# Patient Record
Sex: Female | Born: 1935 | Race: White | Hispanic: No | Marital: Married | Smoking: Never smoker
Health system: Southern US, Community
[De-identification: ages and names within clinical notes are randomized; demographics above are authoritative.]

## PROBLEM LIST (undated history)

## (undated) DIAGNOSIS — N281 Cyst of kidney, acquired: Secondary | ICD-10-CM

## (undated) DIAGNOSIS — I4891 Unspecified atrial fibrillation: Secondary | ICD-10-CM

## (undated) DIAGNOSIS — E039 Hypothyroidism, unspecified: Secondary | ICD-10-CM

## (undated) DIAGNOSIS — M1990 Unspecified osteoarthritis, unspecified site: Secondary | ICD-10-CM

## (undated) DIAGNOSIS — I10 Essential (primary) hypertension: Secondary | ICD-10-CM

## (undated) DIAGNOSIS — K589 Irritable bowel syndrome without diarrhea: Secondary | ICD-10-CM

## (undated) DIAGNOSIS — N289 Disorder of kidney and ureter, unspecified: Secondary | ICD-10-CM

## (undated) DIAGNOSIS — F419 Anxiety disorder, unspecified: Secondary | ICD-10-CM

## (undated) DIAGNOSIS — D369 Benign neoplasm, unspecified site: Secondary | ICD-10-CM

## (undated) DIAGNOSIS — M810 Age-related osteoporosis without current pathological fracture: Secondary | ICD-10-CM

## (undated) DIAGNOSIS — I2510 Atherosclerotic heart disease of native coronary artery without angina pectoris: Secondary | ICD-10-CM

## (undated) DIAGNOSIS — N3281 Overactive bladder: Secondary | ICD-10-CM

## (undated) DIAGNOSIS — K5790 Diverticulosis of intestine, part unspecified, without perforation or abscess without bleeding: Secondary | ICD-10-CM

## (undated) HISTORY — PX: KNEE SURGERY: SHX244

## (undated) HISTORY — DX: Irritable bowel syndrome, unspecified: K58.9

## (undated) HISTORY — DX: Overactive bladder: N32.81

## (undated) HISTORY — DX: Disorder of kidney and ureter, unspecified: N28.9

## (undated) HISTORY — DX: Cyst of kidney, acquired: N28.1

## (undated) HISTORY — PX: APPENDECTOMY: SHX54

## (undated) HISTORY — PX: ABDOMINAL HYSTERECTOMY: SHX81

## (undated) HISTORY — PX: ROTATOR CUFF REPAIR: SHX139

## (undated) HISTORY — DX: Age-related osteoporosis without current pathological fracture: M81.0

## (undated) HISTORY — DX: Benign neoplasm, unspecified site: D36.9

## (undated) HISTORY — DX: Unspecified osteoarthritis, unspecified site: M19.90

## (undated) HISTORY — PX: BACK SURGERY: SHX140

## (undated) HISTORY — DX: Diverticulosis of intestine, part unspecified, without perforation or abscess without bleeding: K57.90

## (undated) HISTORY — PX: CATARACT EXTRACTION: SUR2

---

## 2002-06-04 ENCOUNTER — Encounter: Admission: RE | Admit: 2002-06-04 | Discharge: 2002-06-04 | Payer: Self-pay | Admitting: Family Medicine

## 2002-06-04 ENCOUNTER — Encounter: Payer: Self-pay | Admitting: Family Medicine

## 2002-09-08 ENCOUNTER — Encounter: Payer: Self-pay | Admitting: Family Medicine

## 2002-09-08 ENCOUNTER — Encounter: Admission: RE | Admit: 2002-09-08 | Discharge: 2002-09-08 | Payer: Self-pay | Admitting: Family Medicine

## 2002-09-09 ENCOUNTER — Encounter: Payer: Self-pay | Admitting: Family Medicine

## 2002-09-09 ENCOUNTER — Encounter: Admission: RE | Admit: 2002-09-09 | Discharge: 2002-09-09 | Payer: Self-pay | Admitting: Family Medicine

## 2002-09-20 ENCOUNTER — Encounter: Payer: Self-pay | Admitting: Family Medicine

## 2002-09-20 ENCOUNTER — Encounter: Admission: RE | Admit: 2002-09-20 | Discharge: 2002-09-20 | Payer: Self-pay | Admitting: Family Medicine

## 2003-09-02 ENCOUNTER — Encounter: Admission: RE | Admit: 2003-09-02 | Discharge: 2003-09-02 | Payer: Self-pay | Admitting: Family Medicine

## 2003-09-02 ENCOUNTER — Encounter: Payer: Self-pay | Admitting: Family Medicine

## 2003-11-09 ENCOUNTER — Inpatient Hospital Stay (HOSPITAL_COMMUNITY): Admission: RE | Admit: 2003-11-09 | Discharge: 2003-11-10 | Payer: Self-pay | Admitting: Orthopaedic Surgery

## 2004-10-22 ENCOUNTER — Encounter: Admission: RE | Admit: 2004-10-22 | Discharge: 2004-10-22 | Payer: Self-pay | Admitting: Family Medicine

## 2004-12-04 ENCOUNTER — Encounter: Admission: RE | Admit: 2004-12-04 | Discharge: 2004-12-04 | Payer: Self-pay | Admitting: Family Medicine

## 2004-12-19 ENCOUNTER — Encounter: Admission: RE | Admit: 2004-12-19 | Discharge: 2004-12-19 | Payer: Self-pay | Admitting: Orthopaedic Surgery

## 2005-12-03 ENCOUNTER — Ambulatory Visit (HOSPITAL_COMMUNITY): Admission: RE | Admit: 2005-12-03 | Discharge: 2005-12-03 | Payer: Self-pay | Admitting: Gastroenterology

## 2006-02-07 ENCOUNTER — Encounter: Admission: RE | Admit: 2006-02-07 | Discharge: 2006-02-07 | Payer: Self-pay | Admitting: Family Medicine

## 2006-06-11 ENCOUNTER — Other Ambulatory Visit: Admission: RE | Admit: 2006-06-11 | Discharge: 2006-06-11 | Payer: Self-pay | Admitting: Family Medicine

## 2006-09-23 ENCOUNTER — Ambulatory Visit (HOSPITAL_COMMUNITY): Admission: RE | Admit: 2006-09-23 | Discharge: 2006-09-23 | Payer: Self-pay | Admitting: Urology

## 2007-03-18 ENCOUNTER — Encounter: Admission: RE | Admit: 2007-03-18 | Discharge: 2007-03-18 | Payer: Self-pay | Admitting: Family Medicine

## 2008-02-18 ENCOUNTER — Encounter: Admission: RE | Admit: 2008-02-18 | Discharge: 2008-02-18 | Payer: Self-pay | Admitting: Family Medicine

## 2008-02-26 ENCOUNTER — Encounter: Admission: RE | Admit: 2008-02-26 | Discharge: 2008-02-26 | Payer: Self-pay | Admitting: Family Medicine

## 2008-04-06 ENCOUNTER — Encounter: Admission: RE | Admit: 2008-04-06 | Discharge: 2008-04-06 | Payer: Self-pay | Admitting: Family Medicine

## 2008-05-29 ENCOUNTER — Encounter: Admission: RE | Admit: 2008-05-29 | Discharge: 2008-05-29 | Payer: Self-pay | Admitting: Urology

## 2008-06-05 ENCOUNTER — Emergency Department (HOSPITAL_COMMUNITY): Admission: EM | Admit: 2008-06-05 | Discharge: 2008-06-06 | Payer: Self-pay | Admitting: Emergency Medicine

## 2008-12-28 ENCOUNTER — Encounter: Admission: RE | Admit: 2008-12-28 | Discharge: 2008-12-28 | Payer: Self-pay | Admitting: Family Medicine

## 2009-01-03 ENCOUNTER — Encounter: Admission: RE | Admit: 2009-01-03 | Discharge: 2009-01-03 | Payer: Self-pay | Admitting: Family Medicine

## 2009-06-10 ENCOUNTER — Encounter
Admission: RE | Admit: 2009-06-10 | Discharge: 2009-06-10 | Payer: Self-pay | Admitting: Physical Medicine and Rehabilitation

## 2010-05-21 ENCOUNTER — Encounter: Admission: RE | Admit: 2010-05-21 | Discharge: 2010-05-21 | Payer: Self-pay | Admitting: Gastroenterology

## 2010-08-31 ENCOUNTER — Encounter: Admission: RE | Admit: 2010-08-31 | Discharge: 2010-08-31 | Payer: Self-pay | Admitting: Family Medicine

## 2010-10-19 ENCOUNTER — Encounter: Admission: RE | Admit: 2010-10-19 | Discharge: 2010-10-19 | Payer: Self-pay | Admitting: Geriatric Medicine

## 2011-05-17 NOTE — Op Note (Signed)
NAME:  Linda Avila, GIEL                             ACCOUNT NO.:  [id number]   MEDICAL RECORD NO.:  [id number]                   PATIENT TYPE:  OIB   LOCATION:  2550                                 FACILITY:  [facility]   PHYSICIAN:  Sharolyn Douglas, M.D.                     DATE OF BIRTH:  08/02/1928   DATE OF PROCEDURE:  11/09/2003  DATE OF DISCHARGE:                                 OPERATIVE REPORT   PREOPERATIVE DIAGNOSIS:  C5-6 herniated nucleus pulposus with right upper  extremity radiculopathy.   PROCEDURE:  1. Anterior cervical diskectomy C5-6.  2. Anterior cervical arthrodesis C5-6 with placement of an 8 mm Nuvasive     allograft prosthesis spacer packed with local autogenous bone graft.  3. Anterior cervical plating C5-6 utilizing the Trinica system.   SURGEON:  Sharolyn Douglas, M.D.   ASSISTANT:  Verlin Fester, P.A.   ANESTHESIA:  General endotracheal.   COMPLICATIONS:  None.   INDICATIONS FOR PROCEDURE:  The patient is a 77 year old female with severe  intractable right upper extremity pain and weakness.  Her MRI scan shows a  large disk herniation at C5-6 which compresses the thecal sac and encroaches  on the right foramen.  She has been unresponsive to all conservative care.  She is taking escalating doses of narcotics.  I extensively reviewed the  risks and benefits of ACDF at C5-6 with her and she elected to proceed.  She  understands the risks of adjacent same, degeneration, hardware failure and  pseudoarthrosis.   DESCRIPTION OF PROCEDURE:  The patient was properly identified in the  holding area and taken to the operating room. She underwent general  endotracheal anesthesia without difficulty.  She was given prophylactic IV  antibiotics.  She was carefully positioned on the operating table with her  neck in slight extension.  5 pounds of Halter traction was applied.  The  neck was prepped and draped in the usual sterile fashion.  A 4 cm incision  was made on the left side of  her neck in a transverse fashion, just proximal  to the cricoid cartilage.  Dissection was carried sharply through the  platysma.  The interval between the SCM and strap muscles were developed  down to the prevertebral space.  The first disk space identified was C5-6.  Spinal needle was placed.  X-ray taken to confirm location.  The esophagus,  trachea, carotid sheath were identified and protected at all times.  We then  elevated the longus coli muscle out of the C5-6 disk space bilaterally.  Deep shadowline retractor placed.  We then performed a subtotal diskectomy  back to the posterior longitudinal ligament.  The posterior vertebral  margins were undercut using a Kerrison punch.  We immediately removed  several moderately large pieces of disk material in the left posterolateral  portion of the disk space.  We then  took down the posterior longitudinal  ligament and several additional pieces of disk material were removed in a  subligamentous position.  The foramen was explored with a nerve hook and  again we were able to fish out several pieces of disk material.  The wound  was copiously irrigated.  Bleeding was controlled with bipolar  electrocautery and Gelfoam.  We then placed an 8 mm Nuvasive allograft  prosthesis spacer that had been packed with local autogenous bone collected  from the bur shavings.  We placed a Trinica plate with four 12 mm screws.  We engaged the locking mechanism.  The wound was irrigated.  We examined the  esophagus, trachea, and carotid sheath and there were no apparent injuries.  Penrose drain placed.  Platysma closed with an interrupted 2-0 Vicryl,  subcutaneous layer closed with a 3-0 Vicryl followed by a running 4-0  subcuticular suture on the skin.  Benzoin and Steri-Strips were placed.  A  sterile dressing applied.  The patient was placed into a soft cervical  collar, extubated without difficulty, and transferred to the recovery room  in stable condition,  able to move her upper and lower extremities.                                               Sharolyn Douglas, M.D.    MC/MEDQ  D:  11/09/2003  T:  11/10/2003  Job:  [id number]

## 2011-05-17 NOTE — H&P (Signed)
NAME:  Linda Avila, Linda Avila                             ACCOUNT NO.:  [id number]   MEDICAL RECORD NO.:  [id number]                   PATIENT TYPE:  OIB   LOCATION:  NA                                   FACILITY:  [facility]   PHYSICIAN:  Sharolyn Douglas, M.D.                     DATE OF BIRTH:  1928/05/31   DATE OF ADMISSION:  11/09/2003  DATE OF DISCHARGE:                                HISTORY & PHYSICAL   CHIEF COMPLAINT:  Neck and right upper extremity pain as well as right upper  extremity paresthesias and numbness.   HISTORY OF PRESENT ILLNESS:  The patient is a 77 year old female with neck  and right upper extremity pain that has been severe for the last  approximately 20 days.  She reports prior to 20 days ago, she was not having  any sharp pain in her neck, just occasional aches and pains.  This pain is  severe in nature.  She is having to take very strong narcotic pain  medication to control it, escalating dose and she is absolutely miserable.  She said she cannot live like this any longer.  The pain is so severe.  Because of her severe findings as well as her MRI findings of an HNP at C5-6  with pressure on the C6 nerve root, it was thought that her best course of  management would be ACDF procedure.  Risks and benefits of the procedure  were discussed with the patient as well as her other options.  She indicated  understanding and opted to proceed with surgery.   ALLERGIES:  No known drug allergies.   MEDICATIONS:  1. Levoxyl 100 mcg daily.  2. Actonel q. week.  3. Celebrex daily.  4. Detrol LA 4 mg daily.  5. Norvasc 5 mg daily.  6. Methylprednisolone Dosepak 12 days.  7. Percocet 7.5 p.o. p.r.n.  8. Flexeril p.r.n.   PAST MEDICAL HISTORY:  1. Hypothyroidism.  2. Hypertension.  3. Urinary incontinence.  4. Osteoporosis.   PAST SURGICAL HISTORY:  1. Lumbar surgery in 1997 by Dr. Otelia Sergeant.  2. Right knee scope by Dr. Thomasena Edis in 1998.   SOCIAL HISTORY:  The patient denies  tobacco use and denies alcohol use.  She  is married, retired.  Her husband will be available to help her  postoperatively.  She has six grown children.   FAMILY HISTORY:  This is significant for coronary artery disease and stomach  cancer.   REVIEW OF SYMPTOMS:  She denies fevers, chills, sweats, or bleeding.  __________ blurred vision, double vision, seizures, headache, paralysis.  CARDIOVASCULAR:  Denies chest pain, __________, orthopnea, claudication or  palpitations.  PULMONARY:  Denies shortness of breath, productive cough or  hemoptysis.  GI:  Denies nausea or vomiting.  Positive constipation  secondary to pain medications.  Denies melena, bloody stool or diarrhea.  GU:  Denies dysuria, hematuria or discharge.  MUSCULOSKELETAL:  As per HPI.   PHYSICAL EXAMINATION:  GENERAL APPEARANCE:  The patient is a 77 year old  white female who is alert and oriented in no acute distress.  She is well-  nourished and well-groomed.  The patient today is pleasant and cooperative  to examination.  VITAL SIGNS:  Blood pressure 144/80, respiratory rate 16 and unlabored.  Pulse 80 and regular.  HEENT:  Head is normocephalic and atraumatic.  Pupils are equal, round and  reactive to light.  Extraocular movements intact.  Nose patent.  Pharynx  clear.  NECK:  Soft to palpation with no lymphadenopathy, thyromegaly or bruits  appreciated.  She is wearing a cervical collar today.  CHEST:  Clear to auscultation bilaterally.  No rales, rhonchi, stridor,  wheezes or friction rubs.  BREASTS:  Not pertinent and not performed.  CARDIOVASCULAR:  Regular rate and rhythm with no murmurs, rubs, or gallops  noted.  ABDOMEN:  Soft to palpation with positive bowel sounds throughout, nontender  and nondistended with no organomegaly noted.  GU:  Not pertinent and not performed.  EXTREMITIES:  The patient has right upper extremity pain.  Motor function  and sensation grossly intact.  Pulses intact and symmetric.   Skin intact  without any lesions or rashes.   MRI shows HNP at C5-6 and degenerative disc disease at C5-6.   IMPRESSION:  1. Degenerative disc disease and herniated nucleus pulposus at C5-6 with     right C6 radiculopathy and severe pain.  2. Hypertension.  3. Hypothyroidism.  4. Urinary incontinence.   PLAN:  Admit to [facility] on November 09, 2003, for an  ACDF of C5-6.  This will be done by Dr. Sharolyn Douglas.  The patient has a soft  cervical collar to use both preoperatively and postoperatively.      Verlin Fester, P.A.                       Sharolyn Douglas, M.D.    CM/MEDQ  D:  11/09/2003  T:  11/09/2003  Job:  [id number]

## 2011-09-05 ENCOUNTER — Other Ambulatory Visit: Payer: Self-pay | Admitting: Geriatric Medicine

## 2011-09-05 DIAGNOSIS — Z1231 Encounter for screening mammogram for malignant neoplasm of breast: Secondary | ICD-10-CM

## 2011-10-03 ENCOUNTER — Ambulatory Visit
Admission: RE | Admit: 2011-10-03 | Discharge: 2011-10-03 | Disposition: A | Discharge status: Home / Self Care | Payer: Medicare Other | Source: Ambulatory Visit | Attending: Geriatric Medicine | Admitting: Geriatric Medicine

## 2011-10-03 DIAGNOSIS — Z1231 Encounter for screening mammogram for malignant neoplasm of breast: Secondary | ICD-10-CM

## 2012-02-10 ENCOUNTER — Other Ambulatory Visit: Payer: Self-pay | Admitting: Geriatric Medicine

## 2012-02-10 DIAGNOSIS — R1013 Epigastric pain: Secondary | ICD-10-CM

## 2012-02-13 ENCOUNTER — Ambulatory Visit
Admission: RE | Admit: 2012-02-13 | Discharge: 2012-02-13 | Disposition: A | Discharge status: Home / Self Care | Payer: Medicare Other | Source: Ambulatory Visit | Attending: Geriatric Medicine | Admitting: Geriatric Medicine

## 2012-02-13 DIAGNOSIS — R1013 Epigastric pain: Secondary | ICD-10-CM

## 2012-02-13 MED ORDER — IOHEXOL 300 MG/ML  SOLN
100.0000 mL | Freq: Once | INTRAMUSCULAR | Status: AC | PRN
  Administered 2012-02-13: 75 mL via INTRAVENOUS

## 2012-07-06 DIAGNOSIS — I2510 Atherosclerotic heart disease of native coronary artery without angina pectoris: Secondary | ICD-10-CM | POA: Diagnosis present

## 2012-07-06 DIAGNOSIS — F411 Generalized anxiety disorder: Secondary | ICD-10-CM | POA: Diagnosis present

## 2012-07-06 DIAGNOSIS — I10 Essential (primary) hypertension: Secondary | ICD-10-CM | POA: Diagnosis present

## 2012-07-06 DIAGNOSIS — T45511A Poisoning by anticoagulants, accidental (unintentional), initial encounter: Principal | ICD-10-CM | POA: Diagnosis present

## 2012-07-06 DIAGNOSIS — I4891 Unspecified atrial fibrillation: Secondary | ICD-10-CM | POA: Diagnosis present

## 2012-07-06 DIAGNOSIS — T458X1A Poisoning by other primarily systemic and hematological agents, accidental (unintentional), initial encounter: Secondary | ICD-10-CM | POA: Diagnosis present

## 2012-07-06 DIAGNOSIS — D689 Coagulation defect, unspecified: Secondary | ICD-10-CM | POA: Diagnosis present

## 2012-07-06 DIAGNOSIS — E039 Hypothyroidism, unspecified: Secondary | ICD-10-CM | POA: Diagnosis present

## 2012-07-06 DIAGNOSIS — F1910 Other psychoactive substance abuse, uncomplicated: Secondary | ICD-10-CM | POA: Diagnosis present

## 2012-07-07 ENCOUNTER — Encounter (HOSPITAL_COMMUNITY): Payer: Self-pay | Admitting: *Deleted

## 2012-07-07 ENCOUNTER — Inpatient Hospital Stay (HOSPITAL_COMMUNITY)
Admission: EM | Admit: 2012-07-07 | Discharge: 2012-07-07 | DRG: 918 | Disposition: A | Discharge status: Home / Self Care | Payer: Medicare Other | Attending: Internal Medicine | Admitting: Internal Medicine

## 2012-07-07 ENCOUNTER — Inpatient Hospital Stay (HOSPITAL_COMMUNITY): Payer: Medicare Other

## 2012-07-07 DIAGNOSIS — I10 Essential (primary) hypertension: Secondary | ICD-10-CM

## 2012-07-07 DIAGNOSIS — D689 Coagulation defect, unspecified: Secondary | ICD-10-CM

## 2012-07-07 DIAGNOSIS — I480 Paroxysmal atrial fibrillation: Secondary | ICD-10-CM

## 2012-07-07 DIAGNOSIS — T50904A Poisoning by unspecified drugs, medicaments and biological substances, undetermined, initial encounter: Secondary | ICD-10-CM

## 2012-07-07 DIAGNOSIS — T45515A Adverse effect of anticoagulants, initial encounter: Secondary | ICD-10-CM

## 2012-07-07 DIAGNOSIS — T45511A Poisoning by anticoagulants, accidental (unintentional), initial encounter: Secondary | ICD-10-CM

## 2012-07-07 DIAGNOSIS — T50901A Poisoning by unspecified drugs, medicaments and biological substances, accidental (unintentional), initial encounter: Secondary | ICD-10-CM

## 2012-07-07 DIAGNOSIS — R519 Headache, unspecified: Secondary | ICD-10-CM | POA: Diagnosis present

## 2012-07-07 DIAGNOSIS — I4891 Unspecified atrial fibrillation: Secondary | ICD-10-CM

## 2012-07-07 DIAGNOSIS — D6832 Hemorrhagic disorder due to extrinsic circulating anticoagulants: Secondary | ICD-10-CM

## 2012-07-07 DIAGNOSIS — R51 Headache: Secondary | ICD-10-CM

## 2012-07-07 DIAGNOSIS — I2510 Atherosclerotic heart disease of native coronary artery without angina pectoris: Secondary | ICD-10-CM

## 2012-07-07 DIAGNOSIS — E039 Hypothyroidism, unspecified: Secondary | ICD-10-CM

## 2012-07-07 HISTORY — DX: Unspecified atrial fibrillation: I48.91

## 2012-07-07 HISTORY — DX: Essential (primary) hypertension: I10

## 2012-07-07 HISTORY — DX: Atherosclerotic heart disease of native coronary artery without angina pectoris: I25.10

## 2012-07-07 HISTORY — DX: Anxiety disorder, unspecified: F41.9

## 2012-07-07 HISTORY — DX: Hypothyroidism, unspecified: E03.9

## 2012-07-07 LAB — COMPREHENSIVE METABOLIC PANEL
ALT: 20 U/L (ref 0–35)
AST: 25 U/L (ref 0–37)
AST: 22 U/L (ref 0–37)
Albumin: 3.1 g/dL — ABNORMAL LOW (ref 3.5–5.2)
BUN: 29 mg/dL — ABNORMAL HIGH (ref 6–23)
CO2: 27 mEq/L (ref 19–32)
Calcium: 8.8 mg/dL (ref 8.4–10.5)
Calcium: 8.8 mg/dL (ref 8.4–10.5)
Chloride: 104 mEq/L (ref 96–112)
Creatinine, Ser: 1.36 mg/dL — ABNORMAL HIGH (ref 0.50–1.10)
Creatinine, Ser: 1.06 mg/dL (ref 0.50–1.10)
GFR calc Af Amer: 41 mL/min — ABNORMAL LOW (ref >90)
GFR calc non Af Amer: 35 mL/min — ABNORMAL LOW (ref >90)
Sodium: 137 mEq/L (ref 135–145)
Total Bilirubin: 0.5 mg/dL (ref 0.3–1.2)
Total Protein: 6.6 g/dL (ref 6.0–8.3)
Total Protein: 5.8 g/dL — ABNORMAL LOW (ref 6.0–8.3)

## 2012-07-07 LAB — CBC WITH DIFFERENTIAL/PLATELET
Basophils Absolute: 0 10*3/uL (ref 0.0–0.1)
Eosinophils Absolute: 0 10*3/uL (ref 0.0–0.7)
Eosinophils Relative: 0 % (ref 0–5)
HCT: 36.2 % (ref 36.0–46.0)
MCH: 31.8 pg (ref 26.0–34.0)
MCHC: 33.4 g/dL (ref 30.0–36.0)
MCV: 95 fL (ref 78.0–100.0)
Monocytes Absolute: 1.2 10*3/uL — ABNORMAL HIGH (ref 0.1–1.0)
Platelets: 314 10*3/uL (ref 150–400)
RDW: 15.8 % — ABNORMAL HIGH (ref 11.5–15.5)

## 2012-07-07 LAB — CBC
Hemoglobin: 10.6 g/dL — ABNORMAL LOW (ref 12.0–15.0)
MCH: 30.4 pg (ref 26.0–34.0)
MCV: 92.6 fL (ref 78.0–100.0)
Platelets: 270 10*3/uL (ref 150–400)
RBC: 3.49 MIL/uL — ABNORMAL LOW (ref 3.87–5.11)
WBC: 16.5 10*3/uL — ABNORMAL HIGH (ref 4.0–10.5)

## 2012-07-07 LAB — TYPE AND SCREEN: ABO/RH(D): A POS

## 2012-07-07 LAB — ABO/RH: ABO/RH(D): A POS

## 2012-07-07 LAB — PROTIME-INR
INR: >10 (ref 0.00–1.49)
Prothrombin Time: 24.1 seconds — ABNORMAL HIGH (ref 11.6–15.2)

## 2012-07-07 LAB — APTT: aPTT: 34 seconds (ref 24–37)

## 2012-07-07 MED ORDER — DULOXETINE HCL 30 MG PO CPEP
30.0000 mg | ORAL_CAPSULE | Freq: Two times a day (BID) | ORAL | Status: DC
  Administered 2012-07-07: 30 mg via ORAL
  Filled 2012-07-07 (×2): qty 1

## 2012-07-07 MED ORDER — DILTIAZEM HCL ER 120 MG PO CP24
120.0000 mg | ORAL_CAPSULE | Freq: Every day | ORAL | Status: DC
  Administered 2012-07-07: 120 mg via ORAL
  Filled 2012-07-07: qty 1

## 2012-07-07 MED ORDER — LOSARTAN POTASSIUM 50 MG PO TABS
50.0000 mg | ORAL_TABLET | Freq: Every day | ORAL | Status: DC
  Administered 2012-07-07: 50 mg via ORAL
  Filled 2012-07-07: qty 1

## 2012-07-07 MED ORDER — WARFARIN SODIUM 1 MG PO TABS: ORAL_TABLET | ORAL | Status: DC

## 2012-07-07 MED ORDER — ALPRAZOLAM 0.25 MG PO TABS: 0.2500 mg | ORAL_TABLET | Freq: Every evening | ORAL | Status: DC | PRN

## 2012-07-07 MED ORDER — ACETAMINOPHEN 325 MG PO TABS: 650.0000 mg | ORAL_TABLET | Freq: Four times a day (QID) | ORAL | Status: DC | PRN

## 2012-07-07 MED ORDER — PNEUMOCOCCAL VAC POLYVALENT 25 MCG/0.5ML IJ INJ: 0.5000 mL | INJECTION | INTRAMUSCULAR | Status: DC

## 2012-07-07 MED ORDER — SODIUM CHLORIDE 0.9 % IV SOLN: 250.0000 mL | INTRAVENOUS | Status: DC | PRN

## 2012-07-07 MED ORDER — ACETAMINOPHEN 650 MG RE SUPP: 650.0000 mg | Freq: Four times a day (QID) | RECTAL | Status: DC | PRN

## 2012-07-07 MED ORDER — LEVOTHYROXINE SODIUM 88 MCG PO TABS
88.0000 ug | ORAL_TABLET | Freq: Every day | ORAL | Status: DC
  Administered 2012-07-07: 88 ug via ORAL
  Filled 2012-07-07 (×2): qty 1

## 2012-07-07 MED ORDER — OXYCODONE HCL 5 MG PO TABS: 5.0000 mg | ORAL_TABLET | Freq: Three times a day (TID) | ORAL | Status: DC | PRN

## 2012-07-07 MED ORDER — VITAMIN K1 10 MG/ML IJ SOLN
10.0000 mg | INTRAMUSCULAR | Status: AC
  Administered 2012-07-07: 10 mg via INTRAVENOUS
  Filled 2012-07-07: qty 1

## 2012-07-07 MED ORDER — SODIUM CHLORIDE 0.9 % IJ SOLN
3.0000 mL | Freq: Two times a day (BID) | INTRAMUSCULAR | Status: DC
  Administered 2012-07-07: 3 mL via INTRAVENOUS

## 2012-07-07 MED ORDER — WARFARIN SODIUM 1 MG PO TABS: 1.0000 mg | ORAL_TABLET | ORAL | Status: DC

## 2012-07-07 MED ORDER — PREDNISONE 5 MG PO TABS
5.0000 mg | ORAL_TABLET | Freq: Two times a day (BID) | ORAL | Status: DC
  Administered 2012-07-07: 5 mg via ORAL
  Filled 2012-07-07 (×3): qty 1

## 2012-07-07 MED ORDER — LEVOTHYROXINE SODIUM 88 MCG PO TABS
88.0000 ug | ORAL_TABLET | Freq: Every day | ORAL | Status: DC
  Filled 2012-07-07 (×2): qty 1

## 2012-07-07 MED ORDER — SODIUM CHLORIDE 0.9 % IJ SOLN: 3.0000 mL | INTRAMUSCULAR | Status: DC | PRN

## 2012-07-07 NOTE — ED Notes (Addendum)
Pt was seen by her physician yesterday to check her coumadin levels.  Pt was called yesterday evening after the appointment  at 2300 and was told to come in to the Emergency Department for further evaluation.    Pt is complaining of left hip and lower back pain - which she denies any injury.  Pt also complains of pain on left ankle pain.  There is significant swelling and bruising on the area.  Pt reports "being unsteady" on her feet when she walks.  She reports new bruising on lower lip.

## 2012-07-07 NOTE — ED Notes (Signed)
Called to check on status of PT-INR - lab stated it needed to be repeated.  Will continue to check back on status.

## 2012-07-07 NOTE — ED Notes (Signed)
Dr. Norlene Campbell notified of critical PT-INR

## 2012-07-07 NOTE — ED Notes (Signed)
Patient transported to CT 

## 2012-07-07 NOTE — Progress Notes (Addendum)
Subjective:    She was seen yesterday by Alfonse Ras, Pharm.D. At the [facility] cardiology clinic where her INR point-of-care registered air or message. She was sent to the lab for further evaluation where that measurement was also measuring error. He had given her vitamin K 2.5 mg by mouth in clinic and then called her that evening at my instruction and gave her another 2.5 mg of vitamin K and at that time she stated that she was having more bruising on her ankle/thigh/oozing from her venipuncture site. He instructed her to urgently go to the emergency room which she did.  In the emergency room she received 10 mg of IV vitamin K. Her INR preceding that was greater than 10. Head CT was unremarkable.  Currently she is feeling well, no bruising, no bleeding, no shortness of breath, no chest pain.  We reviewed her medication dosing of Coumadin. She was originally started on 5 mg once a day but only for a few days and she understands this. She had been on 1 mg tablets with occasional 2 mg dosing. She does not admit to taking any medication error. No antibiotics.   Objective:  Vital Signs in the last 24 hours: Temp:  [98.1 F (36.7 C)-98.6 F (37 C)] 98.2 F (36.8 C) (07/09 0800) Pulse Rate:  [69-93] 72  (07/09 0800) Resp:  [12-19] 19  (07/09 0800) BP: (130-159)/(47-81) 154/73 mmHg (07/09 0800) SpO2:  [97 %-100 %] 99 % (07/09 0800) Weight:  [66.3 kg (146 lb 2.6 oz)] 66.3 kg (146 lb 2.6 oz) (07/09 0545)  Intake/Output from previous day:     Physical Exam: General: Well developed, well nourished, in no acute distress. Head:  Normocephalic and atraumatic. Lungs: Clear to auscultation and percussion. Heart: Irreg, No murmur, rubs or gallops.  Abdomen: soft, non-tender, positive bowel sounds. Extremities: Bruising right ankle, right thigh, right hand coagulation and webbing of digits, silver nitrate use yesterday. Neurologic: Alert and oriented x 3.    Lab Results:  [facility] 07/07/12 0034    WBC 15.4*  HGB 12.1  PLT 314    Basename 07/07/12 0034  NA 137  K 4.3  CL 101  CO2 27  GLUCOSE 135*  BUN 35*  CREATININE 1.36*   No results found for this basename: TROPONINI:2,CK,MB:2 in the last 72 hours Hepatic Function Panel  Basename 07/07/12 0034  PROT 6.6  ALBUMIN 3.5  AST 25  ALT 20  ALKPHOS 62  BILITOT 0.3  BILIDIR --  IBILI --   No results found for this basename: CHOL in the last 72 hours No results found for this basename: PROTIME in the last 72 hours  Imaging: Ct Head Wo Contrast  07/07/2012  *RADIOLOGY REPORT*  Clinical Data: Unsteady on feet when ambulating.  CT HEAD WITHOUT CONTRAST  Technique:  Contiguous axial images were obtained from the base of the skull through the vertex without contrast.  Comparison: CT of the head performed 12/28/2008  Findings: There is no evidence of acute infarction, mass lesion, or intra- or extra-axial hemorrhage on CT.  Diffuse periventricular and subcortical white matter change likely reflects small vessel ischemic microangiopathy.  Chronic ischemic change is noted within the basal ganglia bilaterally.  Mild cerebellar atrophy is noted.  Prominence of the sulci suggests mild cortical volume loss.  The brainstem and fourth ventricle are within normal limits.  The basal ganglia are unremarkable in appearance.  The cerebral hemispheres demonstrate grossly normal gray-white differentiation. No mass effect or midline shift is seen.  There is no evidence of fracture; visualized osseous structures are unremarkable in appearance.  The visualized portions of the orbits are within normal limits.  The paranasal sinuses and mastoid air cells are well-aerated.  No significant soft tissue abnormalities are seen.  IMPRESSION:  1.  No acute intracranial pathology seen on CT. 2.  Diffuse small vessel ischemic microangiopathy and mild cortical volume loss; chronic ischemic change within the basal ganglia.  Original Report Authenticated By: Tonia Ghent, M.D.   Personally viewed.    Assessment/Plan:  Principal Problem:  *Coagulopathy Active Problems:  Atrial fibrillation  Headache   - I appreciate the excellent care given by hospitalist team. She has received a total of 5 mg of by mouth vitamin K yesterday as well as 10 mg IV of vitamin K in the emergency room. Hopefully her coagulopathy/elevated INR has been reversed. Her labs have been drawn to repeat her PT/INR. She did not receive FFP.  - For now, of course, we will hold Coumadin for her atrial fibrillation. - [employer], [address] our pharmacist at cardiology clinic, will be seeing her in followup with me as well once coagulopathy is corrected and she safe for discharge.  - She understands to contact us immediately if any further bleeding occurs.  -  I'm unsure of why the coagulopathy occurred. When hypophysis could certainly be that she was mistakingly taking 5 mg tablets instead of 1 mg tablets but she does not think that this is the case. Her daughter will be looking through her medications. Regardless, we will have to be very careful if Coumadin reinitiation takes place. We will decide this on an outpatient basis.  Atrial fibrillation-stable.Rate control.  Appears sinus with PAC's currently. Continue home medications.  If further assistance is needed these do not hesitate to call.  Donato Schultz 07/07/2012, 8:37 AM  Per daughter reviewed her medicines and she was taking 5 mg tablets instead of 1 mg tablets. I have notified [employer], [address]D.who will investigate the situation. Mrs. Eickhoff clearly states that the original prescription for 5 mg did not have any further pills in that bottle. We will closely monitor. I spoke personally with Misty Stanley her daughter on the phone about this. I thanked her for reviewing her prescriptions.

## 2012-07-07 NOTE — Care Management Note (Signed)
    Page 1 of 1   07/07/2012     8:44:50 AM   CARE MANAGEMENT NOTE 07/07/2012  Patient:  Linda Avila, Linda Avila   Account Number:  [id number]  Date Initiated:  07/07/2012  Documentation initiated by:  Junius Creamer  Subjective/Objective Assessment:   adm w suprtherapeutic inr     Action/Plan:   lives w husband, pcp dr Ann Maki [place]   Anticipated DC Date:     Anticipated DC Plan:        DC Planning Services  CM consult      Choice offered to / List presented to:             Status of service:   Medicare Important Message given?   (If response is "NO", the following Medicare IM given date fields will be blank) Date Medicare IM given:   Date Additional Medicare IM given:    Discharge Disposition:    Per UR Regulation:  Reviewed for med. necessity/level of care/duration of stay  If discussed at Long Length of Stay Meetings, dates discussed:    Comments:  7/9 8:44a debbie Briellah Baik rn,bsn [phone]

## 2012-07-07 NOTE — Discharge Summary (Signed)
DISCHARGE SUMMARY  Linda Avila  MR#: [id number]  DOB:July 04, 1928  Date of Admission: 07/07/2012 Date of Discharge: 07/07/2012  Attending Physician:James Danise Mina, MD  Patient's OZD:[id number],QVZ Maisie Fus, MD  Consults: Loraine Leriche Skains,MD/Cardiology  Pertinent discharge information: Patient presented to the hospital with an iatrogenic coagulopathy due to prescription error. The patient's prescription was filled as a 5 mg dosage when in actuality a 1 mg prescription had been ordered. The patient subsequently presented with an INR greater than 10.  Per orders of her cardiologist she is to hold her Coumadin until she is reevaluated at the Coumadin clinic on 07/08/2012.  Discharge Diagnoses: Principal Problem:  *Warfarin-induced coagulopathy Active Problems:  PAF (paroxysmal atrial fibrillation)  Headache  HTN (hypertension)  Hypothyroidism  CAD (coronary artery disease)   Radiology: Ct Head Wo Contrast  07/07/2012  *RADIOLOGY REPORT*  Clinical Data: Unsteady on feet when ambulating.  CT HEAD WITHOUT CONTRAST  Technique:  Contiguous axial images were obtained from the base of the skull through the vertex without contrast.  Comparison: CT of the head performed 12/28/2008  Findings: There is no evidence of acute infarction, mass lesion, or intra- or extra-axial hemorrhage on CT.  Diffuse periventricular and subcortical white matter change likely reflects small vessel ischemic microangiopathy.  Chronic ischemic change is noted within the basal ganglia bilaterally.  Mild cerebellar atrophy is noted.  Prominence of the sulci suggests mild cortical volume loss.  The brainstem and fourth ventricle are within normal limits.  The basal ganglia are unremarkable in appearance.  The cerebral hemispheres demonstrate grossly normal gray-white differentiation. No mass effect or midline shift is seen.  There is no evidence of fracture; visualized osseous structures are unremarkable in appearance.  The visualized  portions of the orbits are within normal limits.  The paranasal sinuses and mastoid air cells are well-aerated.  No significant soft tissue abnormalities are seen.  IMPRESSION:  1.  No acute intracranial pathology seen on CT. 2.  Diffuse small vessel ischemic microangiopathy and mild cortical volume loss; chronic ischemic change within the basal ganglia.  Original Report Authenticated By: Tonia Ghent, M.D.    Laboratory: Results for orders placed during the hospital encounter of 07/07/12 (from the past 48 hour(s))  CBC WITH DIFFERENTIAL     Status: Abnormal   Collection Time   07/07/12 12:34 AM      Component Value Range Comment   WBC 15.4 (*) 4.0 - 10.5 K/uL    RBC 3.81 (*) 3.87 - 5.11 MIL/uL    Hemoglobin 12.1  12.0 - 15.0 g/dL    HCT 56.3  87.5 - 64.3 %    MCV 95.0  78.0 - 100.0 fL    MCH 31.8  26.0 - 34.0 pg    MCHC 33.4  30.0 - 36.0 g/dL    RDW 32.9 (*) 51.8 - 15.5 %    Platelets 314  150 - 400 K/uL    Neutrophils Relative 84 (*) 43 - 77 %    Neutro Abs 12.9 (*) 1.7 - 7.7 K/uL    Lymphocytes Relative 8 (*) 12 - 46 %    Lymphs Abs 1.2  0.7 - 4.0 K/uL    Monocytes Relative 8  3 - 12 %    Monocytes Absolute 1.2 (*) 0.1 - 1.0 K/uL    Eosinophils Relative 0  0 - 5 %    Eosinophils Absolute 0.0  0.0 - 0.7 K/uL    Basophils Relative 0  0 - 1 %    Basophils Absolute  0.0  0.0 - 0.1 K/uL   COMPREHENSIVE METABOLIC PANEL     Status: Abnormal   Collection Time   07/07/12 12:34 AM      Component Value Range Comment   Sodium 137  135 - 145 mEq/L    Potassium 4.3  3.5 - 5.1 mEq/L    Chloride 101  96 - 112 mEq/L    CO2 27  19 - 32 mEq/L    Glucose, Bld 135 (*) 70 - 99 mg/dL    BUN 35 (*) 6 - 23 mg/dL    Creatinine, Ser 2.13 (*) 0.50 - 1.10 mg/dL    Calcium 8.8  8.4 - 08.6 mg/dL    Total Protein 6.6  6.0 - 8.3 g/dL    Albumin 3.5  3.5 - 5.2 g/dL    AST 25  0 - 37 U/L    ALT 20  0 - 35 U/L    Alkaline Phosphatase 62  39 - 117 U/L    Total Bilirubin 0.3  0.3 - 1.2 mg/dL    GFR calc non  Af Amer 35 (*) >90 mL/min    GFR calc Af Amer 41 (*) >90 mL/min   PROTIME-INR     Status: Abnormal   Collection Time   07/07/12 12:34 AM      Component Value Range Comment   Prothrombin Time >90.0 (*) 11.6 - 15.2 seconds    INR >10.00 (*) 0.00 - 1.49   TYPE AND SCREEN     Status: Normal   Collection Time   07/07/12  4:20 AM      Component Value Range Comment   ABO/RH(D) A POS      Antibody Screen NEG      Sample Expiration 07/10/2012     ABO/RH     Status: Normal   Collection Time   07/07/12  4:20 AM      Component Value Range Comment   ABO/RH(D) A POS     MRSA PCR SCREENING     Status: Normal   Collection Time   07/07/12  6:04 AM      Component Value Range Comment   MRSA by PCR NEGATIVE  NEGATIVE   APTT     Status: Normal   Collection Time   07/07/12  8:20 AM      Component Value Range Comment   aPTT 34  24 - 37 seconds   PROTIME-INR     Status: Abnormal   Collection Time   07/07/12  8:20 AM      Component Value Range Comment   Prothrombin Time 24.1 (*) 11.6 - 15.2 seconds    INR 2.12 (*) 0.00 - 1.49   COMPREHENSIVE METABOLIC PANEL     Status: Abnormal   Collection Time   07/07/12  8:20 AM      Component Value Range Comment   Sodium 140  135 - 145 mEq/L    Potassium 3.5  3.5 - 5.1 mEq/L    Chloride 104  96 - 112 mEq/L    CO2 26  19 - 32 mEq/L    Glucose, Bld 85  70 - 99 mg/dL    BUN 29 (*) 6 - 23 mg/dL    Creatinine, Ser 5.78  0.50 - 1.10 mg/dL    Calcium 8.8  8.4 - 46.9 mg/dL    Total Protein 5.8 (*) 6.0 - 8.3 g/dL    Albumin 3.1 (*) 3.5 - 5.2 g/dL    AST 22  0 - 37  U/L    ALT 17  0 - 35 U/L    Alkaline Phosphatase 52  39 - 117 U/L    Total Bilirubin 0.5  0.3 - 1.2 mg/dL    GFR calc non Af Amer 48 (*) >90 mL/min    GFR calc Af Amer 55 (*) >90 mL/min   CBC     Status: Abnormal   Collection Time   07/07/12  8:20 AM      Component Value Range Comment   WBC 16.5 (*) 4.0 - 10.5 K/uL    RBC 3.49 (*) 3.87 - 5.11 MIL/uL    Hemoglobin 10.6 (*) 12.0 - 15.0 g/dL    HCT 65.7 (*)  84.6 - 46.0 %    MCV 92.6  78.0 - 100.0 fL    MCH 30.4  26.0 - 34.0 pg    MCHC 32.8  30.0 - 36.0 g/dL    RDW 96.2 (*) 95.2 - 15.5 %    Platelets 270  150 - 400 K/uL      Medication List  As of 07/07/2012  9:49 AM   TAKE these medications         ALPRAZolam 0.25 MG tablet   Commonly known as: XANAX   Take 0.25 mg by mouth at bedtime as needed. For anxiety      diltiazem 120 MG 24 hr capsule   Commonly known as: DILACOR XR   Take 120 mg by mouth daily.      DULoxetine 30 MG capsule   Commonly known as: CYMBALTA   Take 30 mg by mouth 2 (two) times daily.      levothyroxine 88 MCG tablet   Commonly known as: SYNTHROID, LEVOTHROID   Take 88 mcg by mouth daily.      losartan 50 MG tablet   Commonly known as: COZAAR   Take 50 mg by mouth daily.      oxyCODONE 5 MG immediate release tablet   Commonly known as: Oxy IR/ROXICODONE   Take 5 mg by mouth every 8 (eight) hours as needed. For pain      predniSONE 5 MG tablet   Commonly known as: DELTASONE   Take 5 mg by mouth 2 (two) times daily.      warfarin 1 MG tablet   Commonly known as: COUMADIN   Take 1 tablet (1 mg total) by mouth as directed.            History of present illness: 78 year old female patient with known history of paroxysmal fibrillation chronic Coumadin who complained of bleeding from a cut on her right hand as well as bleeding from small skin tears on the extremities in between the toes. She attempted to stop the bleeding by utilizing a home remedy of the flour that the bleeding did not stop. She was also complaining of headache but denied any focal neurological symptoms such as weakness, numbness or tingling. In the emergency department her physical exam was unremarkable except for multiple ecchymosis in the arms and legs that patient states for nontraumatic in her spontaneous and there was a eschar over the area that previously been bleeding between the first and second fingers of the right hand. Her vital  signs were stable and she was otherwise hemodynamically stable and her O2 saturations were 100%. Stat laboratory data revealed a coagulopathy with a prothrombin time greater than 90 and an INR greater than 10. Patient also had a reactive leukocytosis 15,400 her hemoglobin was stable at 12.1. Because of the  headache the CT of the brain was ordered at time of admission. She was admitted to the step down unit for further monitoring and evaluation.   Hospital Course: Principal Problem:  *Warfarin-induced coagulopathy Active Problems:  PAF (paroxysmal atrial fibrillation)  Headache  HTN (hypertension)  Hypothyroidism  CAD (coronary artery disease)    Warfarin-induced coagulopathy Patient was given vitamin K intravenously in the emergency department for a total of 20 mg. Her Coumadin was placed on hold.  Because of the coagulopathy her primary cardiologist was also consulted. She was evaluated by her primary cardiologist the following morning. She had also had an elevated INR when seen in the Coumadin clinic earlier that day. She had been given a total of 5 mg of vitamin K orally because of bruising and oozing from prior venipuncture sites. Because of  these symptoms and the elevated INR she had been instructed to present to the emergency department urgently. After further review of the patient's medications it appears that the patient had initially been prescribed 1 mg tablets with occasional 2 mg dosing and it appears on a recent refill of her medications she now had 5 mg tablets in her possession. Because of this discrepancy in the dosing Dr Anne Fu asked the [facility] Coumadin clinic pharmacist Alfonse Ras to investigate this situation. After investigation with the patient's pharmacy [facility] determined that the patient's prescription had been filled as a 5 mg dosage instead of a 1 mg dosage of Coumadin. Dr. Anne Fu spoke at length with the patient's daughter who clarifies the patient no longer has  any more 5 mg pills available to her at her home. Followup INR this morning is 2.12 and she is otherwise appropriate for discharge home later today. I discussed with Dr. Anne Fu and he has instructed [place] to hold the patient's Coumadin until she is evaluated at the Coumadin clinic appointment scheduled for 07/08/2012.  Headache  She presented with complaints of headache and given her supratherapeutic INR there were concerns she may have had an intracranial bleed. CT of the head shows no acute process including no bleeding. At the present time patient denies a headache and neurological exam is normal.  Day of Discharge BP 154/73  Pulse 72  Temp 98.2 F (36.8 C) (Oral)  Resp 19  Ht 5\' 5"  (1.651 m)  Wt 66.3 kg (146 lb 2.6 oz)  BMI 24.32 kg/m2  SpO2 99%  Physical Exam: General appearance: alert, cooperative, appears stated age and no distress Resp: clear to auscultation bilaterally Cardio: regular rate and rhythm, S1, S2 normal, no murmur, click, rub or gallop GI: soft, non-tender; bowel sounds normal; no masses,  no organomegaly Musculoskeletal: extremities normal, symmetrical without joint erythema or effusion Skin: Multiple areas of spontaneous ecchymosis subtle contusion, several tiny skin tears with scar, no frank bleeding Neurologic: Grossly normal  Follow-up: You have an appointment at the [facility] Coumadin clinic on 07/08/2012 at 10 AM.  Disposition:  Discharge to home   Linda Avila, ANP pager [phone]    I have examined the patient and reviewed the chart. I agree with the above d/c summary.   Linda Cantor, MD [phone]

## 2012-07-07 NOTE — H&P (Addendum)
Linda Avila is an 78 y.o. female.   Chief Complaint: coagulopathy HPI: 78 yo female with CAD, Pafib Hypthyroidism, c/o slight bleeding from cut on the right hand  Between the 2nd and 3rd toes. Yesterday, she used flower to stop it.  Pt denies taking extra coumadin by mistake.  Pt denies abx use.  Or nsaid use or aspirin use.  Pt states that today her right hand bled again and therefore came to ED for evaluation.  Pt notes headache for the past few days.  Denies focal neurological symptoms. (weakness, numbness, tingling)   Past Medical History  Diagnosis Date  . Coronary artery disease   . Hypertension   . Atrial fibrillation   . Hypothyroidism   . Anxiety     Past Surgical History  Procedure Date  . Back surgery   . Rotator cuff repair     Right  . Knee surgery     Right  . Abdominal hysterectomy   . Appendectomy   . Cataract extraction     Bilateral    Family History  Problem Relation Age of Onset  . Diabetes Mother   . Diabetes Brother   . Diabetes Brother    Social History:  reports that she has never smoked. She does not have any smokeless tobacco history on file. She reports that she does not drink alcohol or use illicit drugs.  Allergies:  Allergies  Allergen Reactions  . Sulfa Antibiotics Other (See Comments)    unknown     (Not in a hospital admission)  Results for orders placed during the hospital encounter of 07/07/12 (from the past 48 hour(s))  CBC WITH DIFFERENTIAL     Status: Abnormal   Collection Time   07/07/12 12:34 AM      Component Value Range Comment   WBC 15.4 (*) 4.0 - 10.5 K/uL    RBC 3.81 (*) 3.87 - 5.11 MIL/uL    Hemoglobin 12.1  12.0 - 15.0 g/dL    HCT 46.9  62.9 - 52.8 %    MCV 95.0  78.0 - 100.0 fL    MCH 31.8  26.0 - 34.0 pg    MCHC 33.4  30.0 - 36.0 g/dL    RDW 41.3 (*) 24.4 - 15.5 %    Platelets 314  150 - 400 K/uL    Neutrophils Relative 84 (*) 43 - 77 %    Neutro Abs 12.9 (*) 1.7 - 7.7 K/uL    Lymphocytes Relative 8 (*) 12 -  46 %    Lymphs Abs 1.2  0.7 - 4.0 K/uL    Monocytes Relative 8  3 - 12 %    Monocytes Absolute 1.2 (*) 0.1 - 1.0 K/uL    Eosinophils Relative 0  0 - 5 %    Eosinophils Absolute 0.0  0.0 - 0.7 K/uL    Basophils Relative 0  0 - 1 %    Basophils Absolute 0.0  0.0 - 0.1 K/uL   COMPREHENSIVE METABOLIC PANEL     Status: Abnormal   Collection Time   07/07/12 12:34 AM      Component Value Range Comment   Sodium 137  135 - 145 mEq/L    Potassium 4.3  3.5 - 5.1 mEq/L    Chloride 101  96 - 112 mEq/L    CO2 27  19 - 32 mEq/L    Glucose, Bld 135 (*) 70 - 99 mg/dL    BUN 35 (*) 6 - 23 mg/dL  Creatinine, Ser 1.36 (*) 0.50 - 1.10 mg/dL    Calcium 8.8  8.4 - 96.0 mg/dL    Total Protein 6.6  6.0 - 8.3 g/dL    Albumin 3.5  3.5 - 5.2 g/dL    AST 25  0 - 37 U/L    ALT 20  0 - 35 U/L    Alkaline Phosphatase 62  39 - 117 U/L    Total Bilirubin 0.3  0.3 - 1.2 mg/dL    GFR calc non Af Amer 35 (*) >90 mL/min    GFR calc Af Amer 41 (*) >90 mL/min   PROTIME-INR     Status: Abnormal   Collection Time   07/07/12 12:34 AM      Component Value Range Comment   Prothrombin Time >90.0 (*) 11.6 - 15.2 seconds    INR >10.00 (*) 0.00 - 1.49    No results found.  Review of Systems  Constitutional: Negative for fever, chills, weight loss, malaise/fatigue and diaphoresis.  HENT: Negative for hearing loss, ear pain, nosebleeds, congestion, sore throat, neck pain, tinnitus and ear discharge.   Eyes: Negative for blurred vision, double vision, photophobia, pain, discharge and redness.  Respiratory: Negative for cough, hemoptysis, sputum production, shortness of breath, wheezing and stridor.   Cardiovascular: Negative for chest pain, palpitations, orthopnea, claudication, leg swelling and PND.  Gastrointestinal: Negative for heartburn, nausea, vomiting, abdominal pain, diarrhea, constipation, blood in stool and melena.  Genitourinary: Negative for dysuria, urgency, frequency, hematuria and flank pain.    Musculoskeletal: Positive for back pain. Negative for myalgias, joint pain and falls.       Chronic back pain  Skin: Negative for itching and rash.  Neurological: Positive for headaches. Negative for dizziness, tingling, tremors, sensory change, speech change, focal weakness, seizures, loss of consciousness and weakness.  Endo/Heme/Allergies: Negative for environmental allergies and polydipsia. Bruises/bleeds easily.  Psychiatric/Behavioral: Negative for depression, suicidal ideas, hallucinations, memory loss and substance abuse. The patient is not nervous/anxious and does not have insomnia.     Blood pressure 140/72, pulse 85, temperature 98.6 F (37 C), temperature source Oral, resp. rate 14, SpO2 100.00%. Physical Exam  Constitutional: She is oriented to person, place, and time. She appears well-developed and well-nourished.  HENT:  Head: Normocephalic and atraumatic.  Mouth/Throat: No oropharyngeal exudate.  Eyes: Conjunctivae are normal. Pupils are equal, round, and reactive to light. Right eye exhibits no discharge. Left eye exhibits no discharge. No scleral icterus.  Neck: Normal range of motion. Neck supple. No JVD present. No tracheal deviation present. No thyromegaly present.  Cardiovascular: Exam reveals no gallop and no friction rub.   No murmur heard.      Irr, irr s1, s2  Respiratory: Effort normal and breath sounds normal. No stridor. No respiratory distress. She has no wheezes. She has no rales. She exhibits no tenderness.  GI: Soft. Bowel sounds are normal. She exhibits no distension and no mass. There is no tenderness. There is no rebound and no guarding.  Musculoskeletal: Normal range of motion. She exhibits no edema and no tenderness.  Lymphadenopathy:    She has no cervical adenopathy.  Neurological: She is alert and oriented to person, place, and time. She has normal reflexes. She displays normal reflexes. No cranial nerve deficit. She exhibits normal muscle tone.  Coordination normal.  Skin: Skin is warm and dry. No rash noted. No erythema. No pallor.       Slight dark area over between the 2nd and 3rd mtp joints, looks  like silver nitrate.    Psychiatric: She has a normal mood and affect. Her behavior is normal. Judgment and thought content normal.     Assessment/Plan Coagulopathy Headache Pafib Hypertension Hypothyroidism  CT brain noncontrast Vitamin K Cont levothyroxine Cont losartan, Diltiazem Check cbc, cmp, pt, ptt in am  Pearson Grippe 07/07/2012, 3:08 AM

## 2012-07-07 NOTE — ED Notes (Signed)
She is on coumadin and since Saturday she has had surface bleeding on her arms legs and buttocks since then

## 2012-07-07 NOTE — Progress Notes (Signed)
DISCHARGED HOME ACCOMPANIED BY HUSBAND, STABLE, BELONGINGS WITH PT.

## 2012-07-07 NOTE — ED Notes (Signed)
Small amount of dark red blood oozing from right ear and right lateral thigh.

## 2012-07-07 NOTE — ED Notes (Signed)
Family at bedside. 

## 2012-07-07 NOTE — ED Provider Notes (Signed)
History     CSN: [id number]  Arrival date & time 07/06/12  2355   First MD Initiated Contact with Patient 07/07/12 0112      Chief Complaint  Patient presents with  . bleeding from surface wounds     (Consider location/radiation/quality/duration/timing/severity/associated sxs/prior treatment) HPI 78 year old female presents to emergency department complaining of worsening bruising and persistent bleeding. Patient was started on Coumadin 3 months ago for paroxysmal atrial fibrillation. She reports that the Coumadin clinic has been adjusting her doses. Over the weekend, patient noted several superficial wounds that persistently bled, and more bruising over her body. Patient was seen in the Coumadin clinic today, had silver nitrate applied to wound on her right hand. She reports her hemoglobin was 12. She does not know if her INR was checked. She reports she was called by the Coumadin clinic, [facility], who told her she should go to the emergency department as "he didn't know why I was still bleeding".  Patient denies any falls, no nausea vomiting diarrhea, no shortness of breath no chest pain no headache no blood noted in urine or stool.  Past Medical History  Diagnosis Date  . Coronary artery disease   . Hypertension   . Atrial fibrillation   . Hypothyroidism   . Anxiety     Past Surgical History  Procedure Date  . Back surgery   . Rotator cuff repair     Right  . Knee surgery     Right  . Abdominal hysterectomy   . Appendectomy   . Cataract extraction     Bilateral    Family History  Problem Relation Age of Onset  . Diabetes Mother   . Diabetes Brother   . Diabetes Brother     History  Substance Use Topics  . Smoking status: Never Smoker   . Smokeless tobacco: Not on file  . Alcohol Use: No    OB History    Grav Para Term Preterm Abortions TAB SAB Ect Mult Living                  Review of Systems  All other systems reviewed and are negative.    Allergies    Sulfa antibiotics  Home Medications   Current Outpatient Rx  Name Route Sig Dispense Refill  . ALPRAZOLAM 0.25 MG PO TABS Oral Take 0.25 mg by mouth at bedtime as needed. For anxiety    . DILTIAZEM HCL ER 120 MG PO CP24 Oral Take 120 mg by mouth daily.    . DULOXETINE HCL 30 MG PO CPEP Oral Take 30 mg by mouth 2 (two) times daily.    Marland Kitchen LEVOTHYROXINE SODIUM 88 MCG PO TABS Oral Take 88 mcg by mouth daily.    Marland Kitchen LOSARTAN POTASSIUM 50 MG PO TABS Oral Take 50 mg by mouth daily.    . OXYCODONE HCL 5 MG PO TABS Oral Take 5 mg by mouth every 8 (eight) hours as needed. For pain    . PREDNISONE 5 MG PO TABS Oral Take 5 mg by mouth 2 (two) times daily.      BP 134/61  Pulse 75  Temp 98.6 F (37 C) (Oral)  Resp 16  SpO2 100%  Physical Exam  Nursing note and vitals reviewed. Constitutional: She is oriented to person, place, and time. She appears well-developed and well-nourished.  HENT:  Head: Normocephalic and atraumatic.  Left Ear: External ear normal.  Nose: Nose normal.  Mouth/Throat: Oropharynx is clear and moist.  Eyes:  Conjunctivae and EOM are normal. Pupils are equal, round, and reactive to light.  Neck: Normal range of motion. Neck supple. No JVD present. No tracheal deviation present. No thyromegaly present.  Cardiovascular: Normal rate, regular rhythm, normal heart sounds and intact distal pulses.  Exam reveals no gallop and no friction rub.   No murmur heard. Pulmonary/Chest: Effort normal and breath sounds normal. No stridor. No respiratory distress. She has no wheezes. She has no rales. She exhibits no tenderness.  Abdominal: Soft. Bowel sounds are normal. She exhibits no distension and no mass. There is no tenderness. There is no rebound and no guarding.  Musculoskeletal: Normal range of motion. She exhibits no edema and no tenderness.  Lymphadenopathy:    She has no cervical adenopathy.  Neurological: She is oriented to person, place, and time. She exhibits normal muscle  tone. Coordination normal.  Skin: Skin is dry. No rash noted. No erythema. No pallor.       Patient with scattered bruising to multiple areas of the body in varying stages. Area in between first and second fingers noted to have HR assistant with silver nitrate location. Patient with persistent oozing from small punctate lesion on right thigh.  Psychiatric: She has a normal mood and affect. Her behavior is normal. Judgment and thought content normal.    ED Course  Procedures (including critical care time)  Labs Reviewed  CBC WITH DIFFERENTIAL - Abnormal; Notable for the following:    WBC 15.4 (*)     RBC 3.81 (*)     RDW 15.8 (*)     Neutrophils Relative 84 (*)     Neutro Abs 12.9 (*)     Lymphocytes Relative 8 (*)     Monocytes Absolute 1.2 (*)     All other components within normal limits  COMPREHENSIVE METABOLIC PANEL - Abnormal; Notable for the following:    Glucose, Bld 135 (*)     BUN 35 (*)     Creatinine, Ser 1.36 (*)     GFR calc non Af Amer 35 (*)     GFR calc Af Amer 41 (*)     All other components within normal limits  PROTIME-INR - Abnormal; Notable for the following:    Prothrombin Time >90.0 (*)     INR >10.00 (*)     All other components within normal limits  TYPE AND SCREEN  APTT  PROTIME-INR  TSH   No results found.   1. Coumadin toxicity   2. Atrial fibrillation   3. Coagulopathy   4. Headache       MDM  78 year old female with INR level greater than 10. Patient without significant active bleeding at this time. Discuss with hospitalist who prefers to treat just with vitamin K alone versus FFP therapy as well. Patient and husband updated on findings and plan.        Olivia Mackie, MD 07/07/12 [phone]

## 2012-10-20 ENCOUNTER — Other Ambulatory Visit: Payer: Self-pay | Admitting: Geriatric Medicine

## 2012-10-20 DIAGNOSIS — Z1231 Encounter for screening mammogram for malignant neoplasm of breast: Secondary | ICD-10-CM

## 2012-11-23 ENCOUNTER — Ambulatory Visit
Admission: RE | Admit: 2012-11-23 | Discharge: 2012-11-23 | Disposition: A | Discharge status: Home / Self Care | Payer: Medicare Other | Source: Ambulatory Visit | Attending: Geriatric Medicine | Admitting: Geriatric Medicine

## 2012-11-23 DIAGNOSIS — Z1231 Encounter for screening mammogram for malignant neoplasm of breast: Secondary | ICD-10-CM

## 2012-12-28 ENCOUNTER — Ambulatory Visit: Payer: Medicare Other | Admitting: Physical Therapy

## 2013-01-04 ENCOUNTER — Ambulatory Visit: Payer: Medicare Other | Attending: Geriatric Medicine | Admitting: Physical Therapy

## 2013-01-04 DIAGNOSIS — IMO0001 Reserved for inherently not codable concepts without codable children: Secondary | ICD-10-CM | POA: Insufficient documentation

## 2013-01-04 DIAGNOSIS — R269 Unspecified abnormalities of gait and mobility: Secondary | ICD-10-CM | POA: Insufficient documentation

## 2013-01-05 ENCOUNTER — Ambulatory Visit: Payer: Medicare Other | Admitting: Physical Therapy

## 2013-01-07 ENCOUNTER — Ambulatory Visit: Payer: Medicare Other | Admitting: Physical Therapy

## 2013-01-11 ENCOUNTER — Ambulatory Visit: Payer: Medicare Other | Admitting: Physical Therapy

## 2013-01-14 ENCOUNTER — Ambulatory Visit: Payer: Medicare Other | Admitting: Physical Therapy

## 2013-01-19 ENCOUNTER — Ambulatory Visit: Payer: Medicare Other | Admitting: Physical Therapy

## 2013-01-20 ENCOUNTER — Encounter: Payer: Medicare Other | Admitting: Physical Therapy

## 2013-01-21 ENCOUNTER — Ambulatory Visit: Payer: Medicare Other | Admitting: Physical Therapy

## 2013-01-25 ENCOUNTER — Ambulatory Visit: Payer: Medicare Other | Admitting: Physical Therapy

## 2013-01-27 ENCOUNTER — Ambulatory Visit: Payer: Medicare Other | Admitting: Physical Therapy

## 2013-02-01 ENCOUNTER — Ambulatory Visit: Payer: Medicare Other | Admitting: Physical Therapy

## 2013-02-04 ENCOUNTER — Ambulatory Visit: Payer: Medicare Other | Attending: Geriatric Medicine | Admitting: Physical Therapy

## 2013-02-04 DIAGNOSIS — IMO0001 Reserved for inherently not codable concepts without codable children: Secondary | ICD-10-CM | POA: Insufficient documentation

## 2013-02-04 DIAGNOSIS — R269 Unspecified abnormalities of gait and mobility: Secondary | ICD-10-CM | POA: Insufficient documentation

## 2013-05-25 ENCOUNTER — Ambulatory Visit (INDEPENDENT_AMBULATORY_CARE_PROVIDER_SITE_OTHER): Payer: Medicare Other | Admitting: Pharmacist Clinician (PhC)/ Clinical Pharmacy Specialist

## 2013-05-25 DIAGNOSIS — I4891 Unspecified atrial fibrillation: Secondary | ICD-10-CM

## 2013-05-25 DIAGNOSIS — Z7901 Long term (current) use of anticoagulants: Secondary | ICD-10-CM

## 2013-07-27 ENCOUNTER — Ambulatory Visit (INDEPENDENT_AMBULATORY_CARE_PROVIDER_SITE_OTHER): Payer: Medicare Other | Admitting: Pharmacist Clinician (PhC)/ Clinical Pharmacy Specialist

## 2013-07-27 DIAGNOSIS — Z7901 Long term (current) use of anticoagulants: Secondary | ICD-10-CM

## 2013-07-27 DIAGNOSIS — I480 Paroxysmal atrial fibrillation: Secondary | ICD-10-CM

## 2013-07-27 DIAGNOSIS — I4891 Unspecified atrial fibrillation: Secondary | ICD-10-CM

## 2013-07-27 LAB — POCT INR: INR: 1.1

## 2013-07-27 NOTE — Progress Notes (Signed)
INR checked prior to injection by Dr. Ethelene Hal.  Pt has normal INR follow up at [facility].  She is aware to contact them for follow up appt in 7-10 days.

## 2013-10-05 ENCOUNTER — Ambulatory Visit (INDEPENDENT_AMBULATORY_CARE_PROVIDER_SITE_OTHER): Payer: Medicare Other | Admitting: Pharmacist

## 2013-10-05 DIAGNOSIS — I4891 Unspecified atrial fibrillation: Secondary | ICD-10-CM

## 2013-10-05 DIAGNOSIS — Z7901 Long term (current) use of anticoagulants: Secondary | ICD-10-CM

## 2013-10-05 LAB — POCT INR: INR: 1.9

## 2013-10-07 ENCOUNTER — Telehealth: Payer: Self-pay | Admitting: Cardiology

## 2013-10-07 NOTE — Telephone Encounter (Signed)
Will forward to Dr. Anne Fu and assist to determine if pt needs to be seen sooner. Dr. Anne Fu is on vacation on 10/13 so will not be able to make an appt that day.

## 2013-10-07 NOTE — Telephone Encounter (Signed)
New Problem   Saw Dr. Larina Bras king 10/8, leg swelling serverly... Now taking two lasix every morning.Pt also asks is her appointment w/ Dr. Anne Fu available when she comes in for her coumadin check Monday 10/13. Please advise.

## 2013-10-08 NOTE — Telephone Encounter (Signed)
New Problem  Pt has new medications and request a call back to discuss its effects on her coumadin intake.Marland Kitchen Please assist

## 2013-10-08 NOTE — Telephone Encounter (Signed)
Pt called to report she was started on prednisone 5mg  BID x 5 days and lasix increased to BID.  Prednisone may cause an increase in INR but pt has an appt on 10/13 to check INR so will not adjust dose at this time.   Will forward previous note as pt is still asking to see Dr. Anne Fu sooner than 11/3.

## 2013-10-09 ENCOUNTER — Encounter (HOSPITAL_COMMUNITY): Payer: Self-pay | Admitting: Emergency Medicine

## 2013-10-09 ENCOUNTER — Emergency Department (HOSPITAL_COMMUNITY): Payer: Medicare Other

## 2013-10-09 ENCOUNTER — Observation Stay (HOSPITAL_COMMUNITY)
Admission: EM | Admit: 2013-10-09 | Discharge: 2013-10-11 | Disposition: A | Discharge status: Home / Self Care | Payer: Medicare Other | Attending: Internal Medicine | Admitting: Internal Medicine

## 2013-10-09 DIAGNOSIS — I2510 Atherosclerotic heart disease of native coronary artery without angina pectoris: Secondary | ICD-10-CM | POA: Insufficient documentation

## 2013-10-09 DIAGNOSIS — I10 Essential (primary) hypertension: Secondary | ICD-10-CM | POA: Insufficient documentation

## 2013-10-09 DIAGNOSIS — F411 Generalized anxiety disorder: Secondary | ICD-10-CM | POA: Insufficient documentation

## 2013-10-09 DIAGNOSIS — R079 Chest pain, unspecified: Secondary | ICD-10-CM

## 2013-10-09 DIAGNOSIS — Z79899 Other long term (current) drug therapy: Secondary | ICD-10-CM | POA: Insufficient documentation

## 2013-10-09 DIAGNOSIS — R109 Unspecified abdominal pain: Secondary | ICD-10-CM | POA: Insufficient documentation

## 2013-10-09 DIAGNOSIS — R0602 Shortness of breath: Secondary | ICD-10-CM | POA: Insufficient documentation

## 2013-10-09 DIAGNOSIS — R4182 Altered mental status, unspecified: Secondary | ICD-10-CM | POA: Insufficient documentation

## 2013-10-09 DIAGNOSIS — Z7901 Long term (current) use of anticoagulants: Secondary | ICD-10-CM | POA: Insufficient documentation

## 2013-10-09 LAB — CBC WITH DIFFERENTIAL/PLATELET
Basophils Absolute: 0 10*3/uL (ref 0.0–0.1)
Basophils Relative: 1 % (ref 0–1)
Hemoglobin: 12.1 g/dL (ref 12.0–15.0)
Lymphocytes Relative: 19 % (ref 12–46)
MCHC: 32.4 g/dL (ref 30.0–36.0)
Neutro Abs: 5.6 10*3/uL (ref 1.7–7.7)
Neutrophils Relative %: 77 % (ref 43–77)
RDW: 12.6 % (ref 11.5–15.5)
WBC: 7.3 10*3/uL (ref 4.0–10.5)

## 2013-10-09 LAB — URINALYSIS, ROUTINE W REFLEX MICROSCOPIC
Bilirubin Urine: NEGATIVE
Glucose, UA: NEGATIVE mg/dL
Nitrite: NEGATIVE
Specific Gravity, Urine: 1.009 (ref 1.005–1.030)
pH: 7.5 (ref 5.0–8.0)

## 2013-10-09 LAB — COMPREHENSIVE METABOLIC PANEL
ALT: 9 U/L (ref 0–35)
AST: 28 U/L (ref 0–37)
Albumin: 3.6 g/dL (ref 3.5–5.2)
Alkaline Phosphatase: 121 U/L — ABNORMAL HIGH (ref 39–117)
CO2: 25 mEq/L (ref 19–32)
Chloride: 98 mEq/L (ref 96–112)
Potassium: 3.2 mEq/L — ABNORMAL LOW (ref 3.5–5.1)
Total Bilirubin: 0.4 mg/dL (ref 0.3–1.2)

## 2013-10-09 LAB — PROTIME-INR
INR: 1.62 — ABNORMAL HIGH (ref 0.00–1.49)
Prothrombin Time: 18.8 seconds — ABNORMAL HIGH (ref 11.6–15.2)

## 2013-10-09 LAB — URINE MICROSCOPIC-ADD ON

## 2013-10-09 MED ORDER — CIPROFLOXACIN HCL 500 MG PO TABS: 500.0000 mg | ORAL_TABLET | Freq: Two times a day (BID) | ORAL | Status: DC

## 2013-10-09 MED ORDER — NITROGLYCERIN 0.4 MG SL SUBL
0.4000 mg | SUBLINGUAL_TABLET | SUBLINGUAL | Status: DC | PRN
  Administered 2013-10-09: 0.4 mg via SUBLINGUAL

## 2013-10-09 MED ORDER — POTASSIUM CHLORIDE CRYS ER 20 MEQ PO TBCR
40.0000 meq | EXTENDED_RELEASE_TABLET | Freq: Every day | ORAL | Status: AC
  Administered 2013-10-10: 40 meq via ORAL
  Filled 2013-10-09 (×2): qty 2

## 2013-10-09 MED ORDER — POTASSIUM CHLORIDE 20 MEQ PO PACK: 40.0000 meq | PACK | Freq: Every day | ORAL | Status: DC

## 2013-10-09 MED ORDER — ASPIRIN 81 MG PO CHEW
324.0000 mg | CHEWABLE_TABLET | Freq: Once | ORAL | Status: AC
  Administered 2013-10-09: 324 mg via ORAL
  Filled 2013-10-09: qty 4

## 2013-10-09 MED ORDER — CIPROFLOXACIN HCL 500 MG PO TABS
500.0000 mg | ORAL_TABLET | Freq: Two times a day (BID) | ORAL | Status: DC
  Administered 2013-10-09 – 2013-10-11 (×4): 500 mg via ORAL
  Filled 2013-10-09 (×6): qty 1

## 2013-10-09 MED ORDER — ACETAMINOPHEN 325 MG PO TABS: 650.0000 mg | ORAL_TABLET | Freq: Once | ORAL | Status: DC

## 2013-10-09 NOTE — ED Notes (Signed)
Family at bedside. 

## 2013-10-09 NOTE — ED Notes (Signed)
MD at bedside. 

## 2013-10-09 NOTE — ED Notes (Addendum)
Patient has a fentanyl 50 mcg patch on her right posterior upper back. She also has a oxytrol patch. Patient is unable to state what the pain patch is specifically for. She is confused and is emotional. Husband is at bedside and is vague on answers.

## 2013-10-09 NOTE — ED Notes (Signed)
Attempted to call report to unit 3W.

## 2013-10-09 NOTE — ED Notes (Signed)
Report called to [employer], RN unit 3W.

## 2013-10-09 NOTE — ED Notes (Signed)
Husband stated, her pain started about 1130 and stated, i feel bad all over and gave her a Nitro around 1230 helped a little bit and then got worse again.  She is really nervous.  Shaking all over but husband stated some shaking is normal.

## 2013-10-09 NOTE — ED Provider Notes (Signed)
CSN: [id number]     Arrival date & time 10/09/13  1441 History   First MD Initiated Contact with Patient 10/09/13 1517     Chief Complaint  Patient presents with  . Chest Pain   (Consider location/radiation/quality/duration/timing/severity/associated sxs/prior Treatment) HPI Comments: Patient presents for evaluation of chest pain. This reports the pain started around 11:30 AM today. She reports a moderate constant pain in the left chest which improved after she took a sublingual nitroglycerin. Patient reports that the pain is now coming back, more radiating down the left arm. She has chronic shortness of breath, but does feel more short of breath with this pain. She had nausea and vomited once.  Husband reports that the patient is very shaky and seems confused.  Patient is a 79 y.o. female presenting with chest pain.  Chest Pain Associated symptoms: shortness of breath and weakness     Past Medical History  Diagnosis Date  . Coronary artery disease   . Hypertension   . Atrial fibrillation   . Hypothyroidism   . Anxiety    Past Surgical History  Procedure Laterality Date  . Back surgery    . Rotator cuff repair      Right  . Knee surgery      Right  . Abdominal hysterectomy    . Appendectomy    . Cataract extraction      Bilateral   Family History  Problem Relation Age of Onset  . Diabetes Mother   . Diabetes Brother   . Diabetes Brother    History  Substance Use Topics  . Smoking status: Never Smoker   . Smokeless tobacco: Not on file  . Alcohol Use: No   OB History   Grav Para Term Preterm Abortions TAB SAB Ect Mult Living                 Review of Systems  Respiratory: Positive for shortness of breath.   Cardiovascular: Positive for chest pain.  Neurological: Positive for weakness.  Psychiatric/Behavioral: Positive for confusion.  All other systems reviewed and are negative.    Allergies  Sulfa antibiotics  Home Medications   Current Outpatient  Rx  Name  Route  Sig  Dispense  Refill  . ALPRAZolam (XANAX) 0.25 MG tablet   Oral   Take 0.25 mg by mouth at bedtime as needed. For anxiety         . diltiazem (DILACOR XR) 120 MG 24 hr capsule   Oral   Take 120 mg by mouth daily.         . DULoxetine (CYMBALTA) 30 MG capsule   Oral   Take 30 mg by mouth 2 (two) times daily.         Marland Kitchen levothyroxine (SYNTHROID, LEVOTHROID) 88 MCG tablet   Oral   Take 88 mcg by mouth daily.         Marland Kitchen losartan (COZAAR) 50 MG tablet   Oral   Take 50 mg by mouth daily.         Marland Kitchen oxyCODONE (OXY IR/ROXICODONE) 5 MG immediate release tablet   Oral   Take 5 mg by mouth every 8 (eight) hours as needed. For pain         . predniSONE (DELTASONE) 5 MG tablet   Oral   Take 5 mg by mouth 2 (two) times daily.         Marland Kitchen warfarin (COUMADIN) 1 MG tablet      Please hold Coumadin until  seen at the Coumadin clinic on 07/08/2012. At that visit he will be instructed when to resume your Coumadin   45 tablet   11    BP 157/86  Pulse 81  Temp(Src) 98.6 F (37 C) (Oral)  Resp 20  SpO2 97% Physical Exam  Constitutional: She is oriented to person, place, and time. She appears well-developed and well-nourished. No distress.  HENT:  Head: Normocephalic and atraumatic.  Right Ear: Hearing normal.  Left Ear: Hearing normal.  Nose: Nose normal.  Mouth/Throat: Oropharynx is clear and moist and mucous membranes are normal.  Eyes: Conjunctivae and EOM are normal. Pupils are equal, round, and reactive to light.  Neck: Normal range of motion. Neck supple.  Cardiovascular: Regular rhythm, S1 normal and S2 normal.  Exam reveals no gallop and no friction rub.   No murmur heard. Pulmonary/Chest: Effort normal and breath sounds normal. No respiratory distress. She exhibits no tenderness.  Abdominal: Soft. Normal appearance and bowel sounds are normal. There is no hepatosplenomegaly. There is no tenderness. There is no rebound, no guarding, no tenderness at  McBurney's point and negative Murphy's sign. No hernia.  Musculoskeletal: Normal range of motion.  Neurological: She is alert and oriented to person, place, and time. She has normal strength. She displays tremor. No cranial nerve deficit or sensory deficit. Coordination normal. GCS eye subscore is 4. GCS verbal subscore is 5. GCS motor subscore is 6.  Skin: Skin is warm, dry and intact. No rash noted. No cyanosis.  Psychiatric: She has a normal mood and affect. Her speech is normal and behavior is normal. Thought content normal.    ED Course  Procedures (including critical care time) Labs Review Labs Reviewed  CBC WITH DIFFERENTIAL  COMPREHENSIVE METABOLIC PANEL  TROPONIN I  PRO B NATRIURETIC PEPTIDE  URINALYSIS, ROUTINE W REFLEX MICROSCOPIC  PROTIME-INR   Imaging Review No results found.  EKG Interpretation     Ventricular Rate:  89 PR Interval:  158 QRS Duration: 72 QT Interval:  364 QTC Calculation: 442 R Axis:   57 Text Interpretation:  Normal sinus rhythm Nonspecific ST abnormality Abnormal ECG            MDM  Diagnosis: 1. Chest pain 2. Mental status changes  Patient presents to the ER primarily for chest pain. Patient had an episode of chest pain that was relieved with nitroglycerin but came back later and then presented to the ER. At arrival, she was experiencing pain in the left chest radiating down the left arm. She was administered aspirin and nitroglycerin and his pain resolved. Patient's records indicate that she has a history of atrial fibrillation and hypertension, but I do not see any definitive cardiac diagnostics. She does, however, have a prescription for nitroglycerin and therefore has some element of coronary artery disease. She will require further evaluation, serial cardiac enzymes, et Karie Soda. Case discussed with cardiology, will evaluate the patient for admission.  Patient very anxious about her. She seems slightly confused. Husband says this is  not her normal baseline. Head CT was negative. Lab work unremarkable. Urinalysis unremarkable. Patient has multiple fentanyl patches on and I suspect this is causing some of her confusion. Patches removed in ED.    Gilda Crease, MD 10/09/13 [phone]

## 2013-10-09 NOTE — H&P (Addendum)
Physician History and Physical    Linda Avila MRN: [id number] DOB/AGE: 77/13/1929 77 y.o. Admit date: 10/09/2013   Primary Cardiologist:  Dr. Anne Fu  CC:  Chest pain  HPI:  79 yo female with h/o CAD, HTN, Afib and anxiety who present to ER with 2 episodes of chest pain earlier today. Chest pain started after she took Prednisone and LT4 and while she became anxious per husband. The pain last couple of mins each time and was left sided radiating to left arm and foot. No SOB/diaphoresis/syncope association.  The pain apparently responded to nitro. Pt also c/o gradually increased LE edema but no significant SOB/PND/orthopnea. She appears to be anxious and mildly confused and a CT head was obtained in ER which was negative for acute process. She is being treated for UTI with cipro. On my interview, pt denied active chest pain. There was no report or complaints of any focal neurological deficit.   Review of systems: A review of 10 organ systems was done and is negative except as stated above in HPI  Past Medical History  Diagnosis Date  . Coronary artery disease   . Hypertension   . Atrial fibrillation   . Hypothyroidism   . Anxiety    Past Surgical History  Procedure Laterality Date  . Back surgery    . Rotator cuff repair      Right  . Knee surgery      Right  . Abdominal hysterectomy    . Appendectomy    . Cataract extraction      Bilateral   History   Social History  . Marital Status: Married    Spouse Name: N/A    Number of Children: N/A  . Years of Education: N/A   Occupational History  . housewife    Social History Main Topics  . Smoking status: Never Smoker   . Smokeless tobacco: Not on file  . Alcohol Use: No  . Drug Use: No  . Sexual Activity: Not on file   Other Topics Concern  . Not on file   Social History Narrative  . No narrative on file    Family History  Problem Relation Age of Onset  . Diabetes Mother   . Diabetes Brother   . Diabetes  Brother      Allergies  Allergen Reactions  . Sulfa Antibiotics Other (See Comments)    unknown    No current facility-administered medications on file prior to encounter.   Current Outpatient Prescriptions on File Prior to Encounter  Medication Sig Dispense Refill  . ALPRAZolam (XANAX) 0.25 MG tablet Take 0.25 mg by mouth at bedtime as needed. For anxiety      . diltiazem (DILACOR XR) 120 MG 24 hr capsule Take 120 mg by mouth daily.      . DULoxetine (CYMBALTA) 30 MG capsule Take 30 mg by mouth 2 (two) times daily.      Marland Kitchen levothyroxine (SYNTHROID, LEVOTHROID) 88 MCG tablet Take 88 mcg by mouth daily.      Marland Kitchen losartan (COZAAR) 50 MG tablet Take 50 mg by mouth daily.      Marland Kitchen oxyCODONE (OXY IR/ROXICODONE) 5 MG immediate release tablet Take 5 mg by mouth every 8 (eight) hours as needed. For pain      . predniSONE (DELTASONE) 5 MG tablet Take 5 mg by mouth 2 (two) times daily.      Marland Kitchen warfarin (COUMADIN) 1 MG tablet Please hold Coumadin until seen at the Coumadin clinic  on 07/08/2012. At that visit he will be instructed when to resume your Coumadin  45 tablet  11    Physical Exam: Blood pressure 171/74, pulse 58, temperature 98.6 F (37 C), temperature source Oral, resp. rate 20, height 5\' 5"  (1.651 m), weight 169 lb (76.658 kg), SpO2 100.00%.; Body mass index is 28.12 kg/(m^2). Temp:  [98.6 F (37 C)] 98.6 F (37 C) (10/11 1458) Pulse Rate:  [58-91] 58 (10/11 1700) Resp:  [18-20] 20 (10/11 1701) BP: (157-174)/(73-86) 171/74 mmHg (10/11 1701) SpO2:  [94 %-100 %] 100 % (10/11 1701) Weight:  [169 lb (76.658 kg)] 169 lb (76.658 kg) (10/11 1554)  No intake or output data in the 24 hours ending 10/09/13 1729 General: NAD Heent: MMM Neck: No JVD  CV: Nondisplaced PMI.  RRR, nl S1/S2, no S3/S4, no murmur. No carotid bruit   Lungs: Clear to auscultation bilaterally with normal respiratory effort Abdomen: Soft, nontender, nondistended Extremities: No clubbing or cyanosis.  Normal pedal  pulses.2+ pedal edema bilat Skin: Intact without lesions or rashes  Neurologic: Alert and oriented x 3 overall but pressed speech at times, grossly nonfocal, very anxious. Psych: anxious    Labs:  Recent Labs  10/09/13 1538  TROPONINI <0.30   Lab Results  Component Value Date   WBC 7.3 10/09/2013   HGB 12.1 10/09/2013   HCT 37.3 10/09/2013   MCV 91.6 10/09/2013   PLT 322 10/09/2013    Recent Labs Lab 10/09/13 1537  NA 139  K 3.2*  CL 98  CO2 25  BUN 17  CREATININE 1.16*  CALCIUM 9.4  PROT 7.0  BILITOT 0.4  ALKPHOS 121*  ALT 9  AST 28  GLUCOSE 164*   No results found for this basename: CHOL, HDL, LDLCALC, TRIG       EKG:  NSR rate of 89 bpm, no specific ST/T changes Radiology:  Dg Chest 2 View  10/09/2013   CLINICAL DATA:  Chest pain and shortness of breath.  EXAM: CHEST  2 VIEW  COMPARISON:  None.  FINDINGS: Lungs are clear. No pneumothorax or pleural effusion. Heart size is normal. The patient is status post lower cervical fusion.  IMPRESSION: No acute disease.   Electronically Signed   By: Drusilla Kanner M.D.   On: 10/09/2013 17:07   Ct Head Wo Contrast  10/09/2013   CLINICAL DATA:  Pain. Mental status changes. Atrial fibrillation. Hypertensive patient.  EXAM: CT HEAD WITHOUT CONTRAST  TECHNIQUE: Contiguous axial images were obtained from the base of the skull through the vertex without intravenous contrast.  COMPARISON:  None.  FINDINGS: No intracranial hemorrhage.  Prominent small vessel disease type changes without CT evidence of large acute infarct.  No intracranial mass lesion noted on this unenhanced exam.  Global atrophy without hydrocephalus.  Vascular calcifications.  Orbital structures unremarkable.  IMPRESSION: No intracranial hemorrhage.  Prominent small vessel disease type changes without CT evidence of large acute infarct.  Atrophy   Electronically Signed   By: Bridgett Larsson M.D.   On: 10/09/2013 16:26    ASSESSMENT:   79 yo female with h/o CAD,  HTN, Afib and anxiety who present to ER with 2 episodes of chest pain earlier today and anxiety in the setting of predisone  IMPRESSION: 1. Atypical chest pain with multiple risk factors and presumed CAD in the past. No previous cath/stress test 2. Anxiety with mild confusion likely 2/2 pain meds and recently resumed Prednisone, not likely TIA as no focal neuro deficit 3. Increased ALP  with ambiguous RUQ pain 4. Edema with increased BNP 2/2 prednisone and mild fluid overload 5. H/o A fib now in sinus 6. H/o hypothryroidism on LT4 7. Hypokalemia 2/2 diuresis  PLAN:  1. Telemetry monitor with Q4  neuro check 2. Cycle Troponin, check TSH, continue ASA and Coumadin, replace K 3. If Troponin elevated overnight, start heparin. If Troponin remains negative, consider outpatient stress test after discharge 4. Echocardiogram to eval systolic and diastolic function 5. Continue Lasix, will hold Prednisone for now. 6. Abd U/S rule out Cholesystitis 7. Continue Cipro for reported UTI   Signed: Haydee Salter, MD Cardiology Fellow 10/09/2013, 5:29 PM

## 2013-10-10 ENCOUNTER — Observation Stay (HOSPITAL_COMMUNITY): Payer: Medicare Other

## 2013-10-10 DIAGNOSIS — R079 Chest pain, unspecified: Secondary | ICD-10-CM

## 2013-10-10 LAB — CBC
HCT: 36.1 % (ref 36.0–46.0)
Hemoglobin: 12.1 g/dL (ref 12.0–15.0)
Hemoglobin: 11.5 g/dL — ABNORMAL LOW (ref 12.0–15.0)
MCV: 90.7 fL (ref 78.0–100.0)
MCV: 91.7 fL (ref 78.0–100.0)
RBC: 3.98 MIL/uL (ref 3.87–5.11)
RBC: 3.85 MIL/uL — ABNORMAL LOW (ref 3.87–5.11)
RDW: 12.8 % (ref 11.5–15.5)
WBC: 7.9 10*3/uL (ref 4.0–10.5)
WBC: 6.2 10*3/uL (ref 4.0–10.5)

## 2013-10-10 LAB — BASIC METABOLIC PANEL
BUN: 14 mg/dL (ref 6–23)
CO2: 27 mEq/L (ref 19–32)
Calcium: 8.8 mg/dL (ref 8.4–10.5)
Chloride: 100 mEq/L (ref 96–112)
Creatinine, Ser: 1.24 mg/dL — ABNORMAL HIGH (ref 0.50–1.10)
Creatinine, Ser: 1.15 mg/dL — ABNORMAL HIGH (ref 0.50–1.10)
GFR calc Af Amer: 45 mL/min — ABNORMAL LOW (ref >90)
GFR calc non Af Amer: 39 mL/min — ABNORMAL LOW (ref >90)
Glucose, Bld: 108 mg/dL — ABNORMAL HIGH (ref 70–99)
Potassium: 2.9 mEq/L — ABNORMAL LOW (ref 3.5–5.1)
Sodium: 141 mEq/L (ref 135–145)

## 2013-10-10 LAB — LIPID PANEL
Cholesterol: 156 mg/dL (ref 0–200)
Total CHOL/HDL Ratio: 2.4 RATIO
Triglycerides: 88 mg/dL (ref <150)
VLDL: 18 mg/dL (ref 0–40)

## 2013-10-10 LAB — CREATININE, SERUM
GFR calc Af Amer: 49 mL/min — ABNORMAL LOW (ref >90)
GFR calc non Af Amer: 42 mL/min — ABNORMAL LOW (ref >90)

## 2013-10-10 LAB — PROTIME-INR
INR: 1.62 — ABNORMAL HIGH (ref 0.00–1.49)
Prothrombin Time: 18.8 seconds — ABNORMAL HIGH (ref 11.6–15.2)

## 2013-10-10 LAB — TROPONIN I: Troponin I: <0.3 ng/mL (ref <0.30)

## 2013-10-10 LAB — TSH: TSH: 10.264 u[IU]/mL — ABNORMAL HIGH (ref 0.350–4.500)

## 2013-10-10 MED ORDER — LEVOTHYROXINE SODIUM 88 MCG PO TABS
88.0000 ug | ORAL_TABLET | Freq: Every day | ORAL | Status: DC
  Administered 2013-10-10 – 2013-10-11 (×2): 88 ug via ORAL
  Filled 2013-10-10 (×3): qty 1

## 2013-10-10 MED ORDER — REGADENOSON 0.4 MG/5ML IV SOLN
0.4000 mg | Freq: Once | INTRAVENOUS | Status: AC
  Administered 2013-10-11: 0.4 mg via INTRAVENOUS
  Filled 2013-10-10: qty 5

## 2013-10-10 MED ORDER — INFLUENZA VAC SPLIT QUAD 0.5 ML IM SUSP
0.5000 mL | INTRAMUSCULAR | Status: AC
  Administered 2013-10-11: 0.5 mL via INTRAMUSCULAR
  Filled 2013-10-10: qty 0.5

## 2013-10-10 MED ORDER — DULOXETINE HCL 30 MG PO CPEP
30.0000 mg | ORAL_CAPSULE | Freq: Two times a day (BID) | ORAL | Status: DC
  Administered 2013-10-10 – 2013-10-11 (×4): 30 mg via ORAL
  Filled 2013-10-10 (×5): qty 1

## 2013-10-10 MED ORDER — ACETAMINOPHEN 325 MG PO TABS
650.0000 mg | ORAL_TABLET | ORAL | Status: DC | PRN
  Administered 2013-10-10: 650 mg via ORAL
  Filled 2013-10-10: qty 2

## 2013-10-10 MED ORDER — LOSARTAN POTASSIUM 50 MG PO TABS
50.0000 mg | ORAL_TABLET | Freq: Every day | ORAL | Status: DC
  Administered 2013-10-10 – 2013-10-11 (×2): 50 mg via ORAL
  Filled 2013-10-10 (×2): qty 1

## 2013-10-10 MED ORDER — POTASSIUM CHLORIDE CRYS ER 20 MEQ PO TBCR
40.0000 meq | EXTENDED_RELEASE_TABLET | Freq: Two times a day (BID) | ORAL | Status: AC
  Administered 2013-10-10 (×2): 40 meq via ORAL
  Filled 2013-10-10: qty 2

## 2013-10-10 MED ORDER — DILTIAZEM HCL ER 120 MG PO CP24
120.0000 mg | ORAL_CAPSULE | Freq: Every day | ORAL | Status: DC
  Administered 2013-10-10 – 2013-10-11 (×2): 120 mg via ORAL
  Filled 2013-10-10 (×2): qty 1

## 2013-10-10 MED ORDER — WARFARIN SODIUM 1 MG PO TABS
1.0000 mg | ORAL_TABLET | Freq: Every day | ORAL | Status: DC
  Administered 2013-10-10 (×2): 1 mg via ORAL
  Filled 2013-10-10 (×3): qty 1

## 2013-10-10 MED ORDER — ALPRAZOLAM 0.25 MG PO TABS
0.2500 mg | ORAL_TABLET | Freq: Every evening | ORAL | Status: DC | PRN
  Administered 2013-10-10: 0.25 mg via ORAL
  Filled 2013-10-10: qty 1

## 2013-10-10 MED ORDER — ONDANSETRON HCL 4 MG/2ML IJ SOLN
4.0000 mg | Freq: Four times a day (QID) | INTRAMUSCULAR | Status: DC | PRN
  Administered 2013-10-10: 4 mg via INTRAVENOUS
  Filled 2013-10-10: qty 2

## 2013-10-10 MED ORDER — ASPIRIN EC 81 MG PO TBEC
81.0000 mg | DELAYED_RELEASE_TABLET | Freq: Every day | ORAL | Status: DC
  Administered 2013-10-10 – 2013-10-11 (×2): 81 mg via ORAL
  Filled 2013-10-10 (×2): qty 1

## 2013-10-10 MED ORDER — FUROSEMIDE 40 MG PO TABS
40.0000 mg | ORAL_TABLET | Freq: Every day | ORAL | Status: DC
  Administered 2013-10-10 – 2013-10-11 (×2): 40 mg via ORAL
  Filled 2013-10-10 (×2): qty 1

## 2013-10-10 MED ORDER — WARFARIN - PHYSICIAN DOSING INPATIENT: Freq: Every day | Status: DC

## 2013-10-10 MED ORDER — HYDROCODONE-ACETAMINOPHEN 5-325 MG PO TABS
1.0000 | ORAL_TABLET | Freq: Four times a day (QID) | ORAL | Status: DC | PRN
  Administered 2013-10-10: 1 via ORAL
  Filled 2013-10-10: qty 1

## 2013-10-10 MED ORDER — HEPARIN SODIUM (PORCINE) 5000 UNIT/ML IJ SOLN
5000.0000 [IU] | Freq: Three times a day (TID) | INTRAMUSCULAR | Status: DC
  Administered 2013-10-10 – 2013-10-11 (×5): 5000 [IU] via SUBCUTANEOUS
  Filled 2013-10-10 (×8): qty 1

## 2013-10-10 MED ORDER — FENTANYL 25 MCG/HR TD PT72
50.0000 ug | MEDICATED_PATCH | TRANSDERMAL | Status: DC
  Administered 2013-10-10: 50 ug via TRANSDERMAL
  Filled 2013-10-10: qty 2

## 2013-10-10 NOTE — Telephone Encounter (Signed)
Reviewed

## 2013-10-10 NOTE — Progress Notes (Signed)
Utilization Review Completed.Linda Avila T10/11/2013  

## 2013-10-10 NOTE — Progress Notes (Signed)
Paged Dr. Verdie Mosher to report K level. Order received to give morning dose of K now and repeat level in 4 hours. Physicist, medical BSN CCM

## 2013-10-10 NOTE — Progress Notes (Signed)
Patient ID: Linda Avila, female   DOB: 03/25/1928, 79 y.o.   MRN: [id number]     Primary cardiologist:  Subjective:   No chest pain overnight.    Objective:   Temp:  [98.1 F (36.7 C)-99 F (37.2 C)] 98.1 F (36.7 C) (10/12 0500) Pulse Rate:  [58-94] 91 (10/12 0500) Resp:  [12-24] 18 (10/12 0500) BP: (115-174)/(58-110) 148/61 mmHg (10/12 0636) SpO2:  [94 %-100 %] 97 % (10/12 0500) Weight:  [165 lb 12.8 oz (75.206 kg)-177 lb (80.287 kg)] 177 lb (80.287 kg) (10/12 0500) Last BM Date: 10/09/13  Filed Weights   10/09/13 1554 10/09/13 2000 10/10/13 0500  Weight: 169 lb (76.658 kg) 165 lb 12.8 oz (75.206 kg) 177 lb (80.287 kg)    Intake/Output Summary (Last 24 hours) at 10/10/13 0857 Last data filed at 10/10/13 0500  Gross per 24 hour  Intake      0 ml  Output    750 ml  Net   -750 ml    Telemetry:sinus rhythm  Exam:  General: NAD  Resp:CTAB  Cardiac: RRR, no m/r/g, no JVD  JY:NWGNFAO soft, NT, ND  ZHY:QMVHQIONGEX warm, no edema  Neuro: no focal deficits  Psych: appears very anxious  Lab Results:  Basic Metabolic Panel:  Recent Labs Lab 10/09/13 1537 10/10/13 0100 10/10/13 0546  NA 139  --  141  K 3.2*  --  2.9*  CL 98  --  101  CO2 25  --  29  GLUCOSE 164*  --  86  BUN 17  --  16  CREATININE 1.16* 1.16* 1.24*  CALCIUM 9.4  --  9.0  MG  --  2.3  --     Liver Function Tests:  Recent Labs Lab 10/09/13 1537  AST 28  ALT 9  ALKPHOS 121*  BILITOT 0.4  PROT 7.0  ALBUMIN 3.6    CBC:  Recent Labs Lab 10/09/13 1537 10/10/13 0100 10/10/13 0546  WBC 7.3 7.9 6.2  HGB 12.1 12.1 11.5*  HCT 37.3 36.1 35.3*  MCV 91.6 90.7 91.7  PLT 322 310 291    Cardiac Enzymes:  Recent Labs Lab 10/09/13 1538 10/10/13 0100 10/10/13 0546  TROPONINI <0.30 <0.30 <0.30    BNP:  Recent Labs  10/09/13 1538  PROBNP 841.6*    Coagulation:  Recent Labs Lab 10/05/13 1150 10/09/13 1554 10/10/13 0546  INR 1.9 1.62* 1.62*     ECG:   Medications:   Scheduled Medications: . acetaminophen  650 mg Oral Once  . aspirin EC  81 mg Oral Daily  . ciprofloxacin  500 mg Oral BID  . diltiazem  120 mg Oral Daily  . DULoxetine  30 mg Oral BID  . fentaNYL  50 mcg Transdermal Q72H  . furosemide  40 mg Oral Daily  . heparin  5,000 Units Subcutaneous Q8H  . levothyroxine  88 mcg Oral QAC breakfast  . losartan  50 mg Oral Daily  . potassium chloride  40 mEq Oral Daily  . warfarin  1 mg Oral q1800  . Warfarin - Physician Dosing Inpatient   Does not apply q1800     Infusions:     PRN Medications:  acetaminophen, ALPRAZolam, HYDROcodone-acetaminophen, nitroGLYCERIN, ondansetron (ZOFRAN) IV   10/10/13 Echo: pending  10/09/13 CXRFINDINGS:  Lungs are clear. No pneumothorax or pleural effusion. Heart size is  normal. The patient is status post lower cervical fusion.  IMPRESSION:  No acute disease.  CT heat w/o contrast IMPRESSION:  No intracranial hemorrhage.  Prominent small vessel disease type changes without CT evidence of  large acute infarct.  Atrophy   Assessment/Plan   79 yo female hx of reported history CAD but unclear details, HTN, Afib admitted with chest pain.  1. Chest pain - Trop neg x3, EKG  - EKG without specific ischemic changes - CAD risk factors: age, HTN, ? Hx CAD - f/u echo,  will plan for lexiscan tomorrow.   2. Hypokalemia: will replete with oral K today  3. Afib - she is currently in sinus rhythm with normal rates,rate controlled with dilt - Her CHADS2 score is 2, will continue coumadin. No high risk indications at this time to bridge with IV anticoag.  4. Mild LFT elevation -  Per H&P notes, seems Alk phos mildly elevated but normal transaminses, and bili - can't get precise history from patient, states she occasionally has some abdominal pain but nothing consistently - f/u results of abd [place]  Dina Rich, M.D., F.A.C.C.

## 2013-10-11 ENCOUNTER — Observation Stay (HOSPITAL_COMMUNITY): Payer: Medicare Other

## 2013-10-11 DIAGNOSIS — R079 Chest pain, unspecified: Secondary | ICD-10-CM

## 2013-10-11 DIAGNOSIS — I517 Cardiomegaly: Secondary | ICD-10-CM

## 2013-10-11 LAB — BASIC METABOLIC PANEL
BUN: 16 mg/dL (ref 6–23)
CO2: 27 mEq/L (ref 19–32)
Chloride: 103 mEq/L (ref 96–112)
Creatinine, Ser: 1.3 mg/dL — ABNORMAL HIGH (ref 0.50–1.10)
GFR calc non Af Amer: 37 mL/min — ABNORMAL LOW (ref >90)
Glucose, Bld: 86 mg/dL (ref 70–99)
Potassium: 4.1 mEq/L (ref 3.5–5.1)

## 2013-10-11 MED ORDER — TECHNETIUM TC 99M SESTAMIBI GENERIC - CARDIOLITE
30.0000 | Freq: Once | INTRAVENOUS | Status: AC | PRN
  Administered 2013-10-11: 30 via INTRAVENOUS

## 2013-10-11 MED ORDER — TECHNETIUM TC 99M SESTAMIBI GENERIC - CARDIOLITE
10.0000 | Freq: Once | INTRAVENOUS | Status: AC | PRN
  Administered 2013-10-11: 10 via INTRAVENOUS

## 2013-10-11 MED ORDER — REGADENOSON 0.4 MG/5ML IV SOLN
INTRAVENOUS | Status: AC
  Filled 2013-10-11: qty 5

## 2013-10-11 NOTE — Progress Notes (Signed)
  Echocardiogram 2D Echocardiogram has been performed.  Cathie Beams 10/11/2013, 2:34 PM

## 2013-10-11 NOTE — Discharge Summary (Signed)
Physician Discharge Summary  Patient ID: Linda Avila MRN: [id number] DOB/AGE: 1928-02-04 79 y.o.  Admit date: 10/09/2013 Discharge date: 10/11/2013 Patient Active Problem List   Diagnosis Date Noted  . Warfarin-induced coagulopathy 07/07/2012  . PAF (paroxysmal atrial fibrillation) 07/07/2012  . Headache 07/07/2012  . Hypothyroidism 07/07/2012  . CAD (coronary artery disease) 07/07/2012  . HTN (hypertension) 07/07/2012   Admission Diagnoses: Chest pain, nonischemic in origin  Discharge Diagnoses:  Paroxysmal atrial fibrillation (normal sinus rhythm throughout the entirety of the hospital stay) Anxiety disorder Chronic Coumadin therapy Hypothyroidism Hypertension History of CAD  Discharged Condition: stable  Hospital Course: 79 year old admitted with atypical chest pain. EKGs were nonischemic and cardiac markers were unremarkable. A pharmacologic nuclear study performed on 10/11/13 was unremarkable. Normal LV function was noted.  Consults: None  Significant Diagnostic Studies: nuclear medicine: Lexiscan. Myocardial perfusion study 10/11/13  Treatments: No specific therapy recommended  Discharge Exam: Blood pressure 141/57, pulse 99, temperature 98.1 F (36.7 C), temperature source Oral, resp. rate 20, height 5\' 5"  (1.651 m), weight 164 lb 11.2 oz (74.707 kg), SpO2 95.00%.   Lungs clear. Cardiac exam unremarkable. No edema  Disposition: 01-Home or Self Care   Future Appointments Provider Department Dept Phone   10/11/2013 3:15 PM [facility] [place] Office [phone]   11/01/2013 11:45 AM Donato Schultz, MD [facility] [phone]       Medication List    STOP taking these medications       oxyCODONE-acetaminophen 5-325 MG per tablet  Commonly known as:  PERCOCET/ROXICET      TAKE these medications       ALPRAZolam 0.25 MG tablet  Commonly known as:  XANAX  Take 0.25 mg by mouth at bedtime as needed. For  anxiety     diltiazem 120 MG 24 hr capsule  Commonly known as:  DILACOR XR  Take 120 mg by mouth daily.     DULoxetine 30 MG capsule  Commonly known as:  CYMBALTA  Take 30 mg by mouth 2 (two) times daily.     fentaNYL 50 MCG/HR  Commonly known as:  DURAGESIC - dosed mcg/hr  Place 1 patch onto the skin every 3 (three) days.     furosemide 20 MG tablet  Commonly known as:  LASIX  Take 40 mg by mouth daily.     levothyroxine 88 MCG tablet  Commonly known as:  SYNTHROID, LEVOTHROID  Take 88 mcg by mouth daily.     losartan 50 MG tablet  Commonly known as:  COZAAR  Take 50 mg by mouth daily.     Potassium Chloride CR 8 MEQ Cpcr capsule CR  Commonly known as:  MICRO-K  Take 8 mEq by mouth daily.     predniSONE 5 MG tablet  Commonly known as:  DELTASONE  Take 5 mg by mouth 2 (two) times daily.     warfarin 1 MG tablet  Commonly known as:  COUMADIN  Please hold Coumadin until seen at the Coumadin clinic on 07/08/2012. At that visit he will be instructed when to resume your Coumadin           Follow-up Information   Call Donato Schultz, MD. (Please try to see the Doctor in 1-2 weeks.)    Specialty:  Cardiology   Contact information:   [address] [place] [address] [phone]       Signed: Lesleigh Noe 10/11/2013, 2:14 PM

## 2013-10-11 NOTE — Progress Notes (Signed)
Reviewed discharge instructions with patient and husband, they stated their understanding.  Patient instructed to followup with primary MD for prednisone instruction.  Discharged home with husband via wheelchair.  Linda Avila

## 2013-10-15 ENCOUNTER — Ambulatory Visit (INDEPENDENT_AMBULATORY_CARE_PROVIDER_SITE_OTHER): Payer: Medicare Other | Admitting: Pharmacist

## 2013-10-15 DIAGNOSIS — I4891 Unspecified atrial fibrillation: Secondary | ICD-10-CM

## 2013-10-15 DIAGNOSIS — Z7901 Long term (current) use of anticoagulants: Secondary | ICD-10-CM

## 2013-10-16 ENCOUNTER — Encounter: Payer: Self-pay | Admitting: *Deleted

## 2013-10-16 ENCOUNTER — Encounter: Payer: Self-pay | Admitting: Cardiology

## 2013-10-19 ENCOUNTER — Ambulatory Visit (INDEPENDENT_AMBULATORY_CARE_PROVIDER_SITE_OTHER): Payer: Medicare Other | Admitting: Pharmacist

## 2013-10-19 ENCOUNTER — Encounter: Payer: Self-pay | Admitting: Cardiology

## 2013-10-19 ENCOUNTER — Ambulatory Visit (INDEPENDENT_AMBULATORY_CARE_PROVIDER_SITE_OTHER): Payer: Medicare Other | Admitting: Cardiology

## 2013-10-19 VITALS — BP 142/60 | HR 56 | Ht 66.0 in | Wt 166.0 lb

## 2013-10-19 DIAGNOSIS — I10 Essential (primary) hypertension: Secondary | ICD-10-CM

## 2013-10-19 DIAGNOSIS — I2510 Atherosclerotic heart disease of native coronary artery without angina pectoris: Secondary | ICD-10-CM

## 2013-10-19 DIAGNOSIS — I480 Paroxysmal atrial fibrillation: Secondary | ICD-10-CM

## 2013-10-19 DIAGNOSIS — I4891 Unspecified atrial fibrillation: Secondary | ICD-10-CM

## 2013-10-19 NOTE — Progress Notes (Signed)
[address] [place], [place]  [address] Phone: [phone] Fax:  [phone]  Date:  10/19/2013   ID:  Linda Avila, DOB Mar 05, 1928, MRN [id number]  PCP:  Ginette Otto, MD   History of Present Illness: Linda Avila is a 79 y.o. female patient of Dr. Laverle Hobby with atrial fibrillation, chronic anticoagulation with prior issues with highly elevated INR, falls with memory impairment here for followup.  Renal function normal. Edema pedal - Lasix 40 in am. Feet are uncomfortable on bottoms of feet. 10/09/13 - had chest pain and dyspnea mostly. ER checked her out 10/11/13 - NUC stress: normal EF, no ischemia. 10/11/13 - ECHO - EF normal, LVH, DD  Wt Readings from Last 3 Encounters:  10/19/13 166 lb (75.297 kg)  10/11/13 164 lb 11.2 oz (74.707 kg)  07/07/12 146 lb 2.6 oz (66.3 kg)     Past Medical History  Diagnosis Date  . Coronary artery disease   . Hypertension   . Atrial fibrillation   . Hypothyroidism   . Anxiety   . Hypothyroidism   . Anxiety   . Overactive bladder   . Renal cyst   . IBS (irritable bowel syndrome)   . Diverticulosis   . Osteoarthritis   . Renal disease   . Adenomatous polyp   . Osteoporosis     Past Surgical History  Procedure Laterality Date  . Back surgery    . Rotator cuff repair      Right  . Knee surgery      Right  . Abdominal hysterectomy    . Appendectomy    . Cataract extraction      Bilateral    Current Outpatient Prescriptions  Medication Sig Dispense Refill  . ALPRAZolam (XANAX) 0.25 MG tablet Take 0.25 mg by mouth at bedtime as needed. For anxiety      . diltiazem (DILACOR XR) 120 MG 24 hr capsule Take 120 mg by mouth daily.      . DULoxetine (CYMBALTA) 30 MG capsule Take 30 mg by mouth 2 (two) times daily.      . fentaNYL (DURAGESIC - DOSED MCG/HR) 50 MCG/HR Place 1 patch onto the skin every 3 (three) days.      . furosemide (LASIX) 20 MG tablet Take 40 mg by mouth daily.      Marland Kitchen levothyroxine  (SYNTHROID, LEVOTHROID) 88 MCG tablet Take 88 mcg by mouth daily.      Marland Kitchen losartan (COZAAR) 50 MG tablet Take 50 mg by mouth daily.      . Potassium Chloride CR (MICRO-K) 8 MEQ CPCR capsule CR Take 8 mEq by mouth daily.      . predniSONE (DELTASONE) 5 MG tablet Take 5 mg by mouth 2 (two) times daily.      Marland Kitchen warfarin (COUMADIN) 1 MG tablet Please hold Coumadin until seen at the Coumadin clinic on 07/08/2012. At that visit he will be instructed when to resume your Coumadin  45 tablet  11   No current facility-administered medications for this visit.    Allergies:    Allergies  Allergen Reactions  . Sulfa Antibiotics Other (See Comments)    unknown    Social History:  The patient  reports that she has never smoked. She does not have any smokeless tobacco history on file. She reports that she does not drink alcohol or use illicit drugs.   ROS:  Please see the history of present illness.  PHYSICAL EXAM: VS:  BP 142/60  Pulse 56  Ht 5\' 6"  (1.676 m)  Wt 166 lb (75.297 kg)  BMI 26.81 kg/m2 Well nourished, well developed, in no acute distress HEENT: normal Neck: no JVD Cardiac:  normal S1, S2; RRR; no murmur Lungs:  clear to auscultation bilaterally, no wheezing, rhonchi or rales Abd: soft, nontender, no hepatomegaly Ext: 1-2+ pedal edema  Skin: warm and dry Neuro: no focal abnormalities noted    ASSESSMENT AND PLAN:  1. Pedal edema - lasix in am. Helping. Has not increased salt. Hypokalemia. Supplement. I've reviewed her recent cardiac history. Echocardiogram reassuring, mild diastolic dysfunction, normal EF. Very remote history of smoking. No overt evidence of pulmonary hypertension, secondary on echocardiogram. Continue with Lasix 40 mg once a day. I also suggested compression hose. Elevated extremities. 2. Chest pain-reassuring nuclear stress test in hospital setting. 3. Hypokalemia-supplementation. 4. Paroxysmal atrial fibrillation-she was in sinus rhythm during  hospitalization.  Signed, Donato Schultz, MD [facility]  10/19/2013 12:05 PM

## 2013-10-19 NOTE — Patient Instructions (Signed)
Your physician recommends that you continue on your current medications as directed. Please refer to the Current Medication list given to you today.  Your physician wants you to follow-up in: 6 months with Dr. Skains. You will receive a reminder letter in the mail two months in advance. If you don't receive a letter, please call our office to schedule the follow-up appointment.  

## 2013-10-29 ENCOUNTER — Encounter: Payer: Medicare Other | Admitting: Cardiology

## 2013-11-01 ENCOUNTER — Ambulatory Visit: Payer: Medicare Other | Admitting: Cardiology

## 2013-11-03 ENCOUNTER — Ambulatory Visit (INDEPENDENT_AMBULATORY_CARE_PROVIDER_SITE_OTHER): Payer: Medicare Other | Admitting: Pharmacist

## 2013-11-03 DIAGNOSIS — I4891 Unspecified atrial fibrillation: Secondary | ICD-10-CM

## 2013-11-03 LAB — POCT INR: INR: 2.4

## 2013-11-10 ENCOUNTER — Ambulatory Visit: Payer: Medicare Other | Admitting: Cardiology

## 2013-12-06 ENCOUNTER — Other Ambulatory Visit (HOSPITAL_COMMUNITY): Payer: Self-pay | Admitting: Urology

## 2013-12-07 ENCOUNTER — Encounter (HOSPITAL_COMMUNITY): Payer: Self-pay | Admitting: Emergency Medicine

## 2013-12-07 ENCOUNTER — Inpatient Hospital Stay (HOSPITAL_COMMUNITY)
Admission: EM | Admit: 2013-12-07 | Discharge: 2013-12-10 | DRG: 872 | Disposition: A | Discharge status: Home / Self Care | Payer: Medicare Other | Attending: Internal Medicine | Admitting: Internal Medicine

## 2013-12-07 ENCOUNTER — Emergency Department (HOSPITAL_COMMUNITY): Payer: Medicare Other

## 2013-12-07 DIAGNOSIS — F411 Generalized anxiety disorder: Secondary | ICD-10-CM | POA: Diagnosis present

## 2013-12-07 DIAGNOSIS — N318 Other neuromuscular dysfunction of bladder: Secondary | ICD-10-CM | POA: Diagnosis present

## 2013-12-07 DIAGNOSIS — R7309 Other abnormal glucose: Secondary | ICD-10-CM | POA: Diagnosis present

## 2013-12-07 DIAGNOSIS — I480 Paroxysmal atrial fibrillation: Secondary | ICD-10-CM

## 2013-12-07 DIAGNOSIS — K589 Irritable bowel syndrome without diarrhea: Secondary | ICD-10-CM | POA: Diagnosis present

## 2013-12-07 DIAGNOSIS — E860 Dehydration: Secondary | ICD-10-CM | POA: Diagnosis present

## 2013-12-07 DIAGNOSIS — I10 Essential (primary) hypertension: Secondary | ICD-10-CM | POA: Diagnosis present

## 2013-12-07 DIAGNOSIS — R1032 Left lower quadrant pain: Secondary | ICD-10-CM | POA: Diagnosis present

## 2013-12-07 DIAGNOSIS — T45515A Adverse effect of anticoagulants, initial encounter: Secondary | ICD-10-CM | POA: Diagnosis present

## 2013-12-07 DIAGNOSIS — S301XXA Contusion of abdominal wall, initial encounter: Secondary | ICD-10-CM

## 2013-12-07 DIAGNOSIS — IMO0002 Reserved for concepts with insufficient information to code with codable children: Secondary | ICD-10-CM

## 2013-12-07 DIAGNOSIS — E872 Acidosis, unspecified: Secondary | ICD-10-CM | POA: Diagnosis present

## 2013-12-07 DIAGNOSIS — I4891 Unspecified atrial fibrillation: Secondary | ICD-10-CM | POA: Diagnosis present

## 2013-12-07 DIAGNOSIS — S301XXD Contusion of abdominal wall, subsequent encounter: Secondary | ICD-10-CM

## 2013-12-07 DIAGNOSIS — R791 Abnormal coagulation profile: Secondary | ICD-10-CM | POA: Diagnosis present

## 2013-12-07 DIAGNOSIS — T380X5A Adverse effect of glucocorticoids and synthetic analogues, initial encounter: Secondary | ICD-10-CM | POA: Diagnosis present

## 2013-12-07 DIAGNOSIS — R51 Headache: Secondary | ICD-10-CM

## 2013-12-07 DIAGNOSIS — Q619 Cystic kidney disease, unspecified: Secondary | ICD-10-CM

## 2013-12-07 DIAGNOSIS — T6591XS Toxic effect of unspecified substance, accidental (unintentional), sequela: Secondary | ICD-10-CM

## 2013-12-07 DIAGNOSIS — I498 Other specified cardiac arrhythmias: Secondary | ICD-10-CM | POA: Diagnosis present

## 2013-12-07 DIAGNOSIS — D649 Anemia, unspecified: Secondary | ICD-10-CM | POA: Diagnosis present

## 2013-12-07 DIAGNOSIS — Z7901 Long term (current) use of anticoagulants: Secondary | ICD-10-CM

## 2013-12-07 DIAGNOSIS — A419 Sepsis, unspecified organism: Secondary | ICD-10-CM | POA: Diagnosis present

## 2013-12-07 DIAGNOSIS — K5730 Diverticulosis of large intestine without perforation or abscess without bleeding: Secondary | ICD-10-CM | POA: Diagnosis present

## 2013-12-07 DIAGNOSIS — M353 Polymyalgia rheumatica: Secondary | ICD-10-CM | POA: Diagnosis present

## 2013-12-07 DIAGNOSIS — E2749 Other adrenocortical insufficiency: Secondary | ICD-10-CM | POA: Diagnosis present

## 2013-12-07 DIAGNOSIS — D62 Acute posthemorrhagic anemia: Secondary | ICD-10-CM | POA: Diagnosis present

## 2013-12-07 DIAGNOSIS — M1990 Unspecified osteoarthritis, unspecified site: Secondary | ICD-10-CM | POA: Diagnosis present

## 2013-12-07 DIAGNOSIS — N182 Chronic kidney disease, stage 2 (mild): Secondary | ICD-10-CM | POA: Diagnosis present

## 2013-12-07 DIAGNOSIS — M810 Age-related osteoporosis without current pathological fracture: Secondary | ICD-10-CM | POA: Diagnosis present

## 2013-12-07 DIAGNOSIS — N181 Chronic kidney disease, stage 1: Secondary | ICD-10-CM | POA: Diagnosis present

## 2013-12-07 DIAGNOSIS — Z79899 Other long term (current) drug therapy: Secondary | ICD-10-CM

## 2013-12-07 DIAGNOSIS — I2510 Atherosclerotic heart disease of native coronary artery without angina pectoris: Secondary | ICD-10-CM | POA: Diagnosis present

## 2013-12-07 DIAGNOSIS — T50901S Poisoning by unspecified drugs, medicaments and biological substances, accidental (unintentional), sequela: Secondary | ICD-10-CM

## 2013-12-07 DIAGNOSIS — N390 Urinary tract infection, site not specified: Secondary | ICD-10-CM | POA: Diagnosis present

## 2013-12-07 DIAGNOSIS — N179 Acute kidney failure, unspecified: Secondary | ICD-10-CM | POA: Diagnosis present

## 2013-12-07 DIAGNOSIS — E039 Hypothyroidism, unspecified: Secondary | ICD-10-CM | POA: Diagnosis present

## 2013-12-07 DIAGNOSIS — I129 Hypertensive chronic kidney disease with stage 1 through stage 4 chronic kidney disease, or unspecified chronic kidney disease: Secondary | ICD-10-CM | POA: Diagnosis present

## 2013-12-07 LAB — LACTIC ACID, PLASMA: Lactic Acid, Venous: 2.4 mmol/L — ABNORMAL HIGH (ref 0.5–2.2)

## 2013-12-07 LAB — CBC WITH DIFFERENTIAL/PLATELET
Basophils Absolute: 0 10*3/uL (ref 0.0–0.1)
Basophils Relative: 0 % (ref 0–1)
Eosinophils Relative: 0 % (ref 0–5)
HCT: 31.8 % — ABNORMAL LOW (ref 36.0–46.0)
Lymphocytes Relative: 7 % — ABNORMAL LOW (ref 12–46)
MCH: 30 pg (ref 26.0–34.0)
MCHC: 32.1 g/dL (ref 30.0–36.0)
MCV: 93.5 fL (ref 78.0–100.0)
Monocytes Absolute: 1.5 10*3/uL — ABNORMAL HIGH (ref 0.1–1.0)
Platelets: 244 10*3/uL (ref 150–400)
RDW: 16.9 % — ABNORMAL HIGH (ref 11.5–15.5)

## 2013-12-07 LAB — COMPREHENSIVE METABOLIC PANEL
AST: 17 U/L (ref 0–37)
Alkaline Phosphatase: 77 U/L (ref 39–117)
BUN: 35 mg/dL — ABNORMAL HIGH (ref 6–23)
CO2: 20 mEq/L (ref 19–32)
Calcium: 8.7 mg/dL (ref 8.4–10.5)
Chloride: 104 mEq/L (ref 96–112)
Creatinine, Ser: 1.48 mg/dL — ABNORMAL HIGH (ref 0.50–1.10)
GFR calc non Af Amer: 32 mL/min — ABNORMAL LOW (ref >90)
Sodium: 141 mEq/L (ref 135–145)
Total Bilirubin: 0.5 mg/dL (ref 0.3–1.2)

## 2013-12-07 LAB — URINE MICROSCOPIC-ADD ON

## 2013-12-07 LAB — PROTIME-INR
INR: 4.01 — ABNORMAL HIGH (ref 0.00–1.49)
INR: 2.95 — ABNORMAL HIGH (ref 0.00–1.49)
Prothrombin Time: 37.5 seconds — ABNORMAL HIGH (ref 11.6–15.2)
Prothrombin Time: 29.7 seconds — ABNORMAL HIGH (ref 11.6–15.2)

## 2013-12-07 LAB — CBC
HCT: 21 % — ABNORMAL LOW (ref 36.0–46.0)
Hemoglobin: 8.8 g/dL — ABNORMAL LOW (ref 12.0–15.0)
MCH: 31 pg (ref 26.0–34.0)
MCHC: 33.8 g/dL (ref 30.0–36.0)
MCV: 92.3 fL (ref 78.0–100.0)
MCV: 92.1 fL (ref 78.0–100.0)
Platelets: 206 10*3/uL (ref 150–400)
RBC: 2.84 MIL/uL — ABNORMAL LOW (ref 3.87–5.11)
RDW: 17.2 % — ABNORMAL HIGH (ref 11.5–15.5)
WBC: 20 10*3/uL — ABNORMAL HIGH (ref 4.0–10.5)
WBC: 17.2 10*3/uL — ABNORMAL HIGH (ref 4.0–10.5)

## 2013-12-07 LAB — URINALYSIS, ROUTINE W REFLEX MICROSCOPIC
Glucose, UA: NEGATIVE mg/dL
Nitrite: NEGATIVE
Protein, ur: 100 mg/dL — AB
Urobilinogen, UA: 0.2 mg/dL (ref 0.0–1.0)

## 2013-12-07 LAB — CG4 I-STAT (LACTIC ACID): Lactic Acid, Venous: 6.06 mmol/L — ABNORMAL HIGH (ref 0.5–2.2)

## 2013-12-07 LAB — SAMPLE TO BLOOD BANK

## 2013-12-07 LAB — GLUCOSE, CAPILLARY: Glucose-Capillary: 122 mg/dL — ABNORMAL HIGH (ref 70–99)

## 2013-12-07 LAB — LIPASE, BLOOD: Lipase: 27 U/L (ref 11–59)

## 2013-12-07 MED ORDER — DEXTROSE 5 % IV SOLN
1.0000 g | Freq: Two times a day (BID) | INTRAVENOUS | Status: DC
  Administered 2013-12-07 – 2013-12-10 (×7): 1 g via INTRAVENOUS
  Filled 2013-12-07 (×10): qty 1

## 2013-12-07 MED ORDER — HYDROCORTISONE SOD SUCCINATE 100 MG IJ SOLR
50.0000 mg | Freq: Four times a day (QID) | INTRAMUSCULAR | Status: DC
  Administered 2013-12-07 – 2013-12-08 (×4): 50 mg via INTRAVENOUS
  Filled 2013-12-07 (×7): qty 1

## 2013-12-07 MED ORDER — SODIUM CHLORIDE 0.9 % IV SOLN
INTRAVENOUS | Status: DC
  Administered 2013-12-07: 08:00:00 via INTRAVENOUS

## 2013-12-07 MED ORDER — INSULIN ASPART 100 UNIT/ML ~~LOC~~ SOLN
0.0000 [IU] | Freq: Three times a day (TID) | SUBCUTANEOUS | Status: DC
  Administered 2013-12-07 – 2013-12-08 (×3): 1 [IU] via SUBCUTANEOUS
  Filled 2013-12-07: qty 1

## 2013-12-07 MED ORDER — ASPIRIN 300 MG RE SUPP: 300.0000 mg | RECTAL | Status: DC

## 2013-12-07 MED ORDER — SODIUM CHLORIDE 0.9 % IV SOLN: 250.0000 mL | INTRAVENOUS | Status: DC | PRN

## 2013-12-07 MED ORDER — IOHEXOL 300 MG/ML  SOLN
80.0000 mL | Freq: Once | INTRAMUSCULAR | Status: AC | PRN
  Administered 2013-12-07: 80 mL via INTRAVENOUS

## 2013-12-07 MED ORDER — MORPHINE SULFATE 4 MG/ML IJ SOLN
4.0000 mg | Freq: Once | INTRAMUSCULAR | Status: AC
  Administered 2013-12-07: 4 mg via INTRAVENOUS
  Filled 2013-12-07: qty 1

## 2013-12-07 MED ORDER — ONDANSETRON HCL 4 MG/2ML IJ SOLN
4.0000 mg | Freq: Once | INTRAMUSCULAR | Status: AC
  Administered 2013-12-07: 4 mg via INTRAVENOUS
  Filled 2013-12-07: qty 2

## 2013-12-07 MED ORDER — PANTOPRAZOLE SODIUM 40 MG IV SOLR
40.0000 mg | Freq: Every day | INTRAVENOUS | Status: DC
  Administered 2013-12-07: 40 mg via INTRAVENOUS
  Filled 2013-12-07 (×2): qty 40

## 2013-12-07 MED ORDER — SODIUM CHLORIDE 0.9 % IV SOLN
INTRAVENOUS | Status: DC
  Administered 2013-12-07: 12:00:00 via INTRAVENOUS

## 2013-12-07 MED ORDER — CEFTRIAXONE SODIUM 1 G IJ SOLR
1.0000 g | INTRAMUSCULAR | Status: DC
  Administered 2013-12-07: 1 g via INTRAVENOUS
  Filled 2013-12-07: qty 10

## 2013-12-07 MED ORDER — ASPIRIN 81 MG PO CHEW: 324.0000 mg | CHEWABLE_TABLET | ORAL | Status: DC

## 2013-12-07 MED ORDER — VITAMIN K1 10 MG/ML IJ SOLN
5.0000 mg | Freq: Once | INTRAVENOUS | Status: AC
  Administered 2013-12-07: 5 mg via INTRAVENOUS
  Filled 2013-12-07: qty 0.5

## 2013-12-07 MED ORDER — IOHEXOL 300 MG/ML  SOLN: 25.0000 mL | INTRAMUSCULAR | Status: AC

## 2013-12-07 NOTE — ED Notes (Signed)
Per family pt has hx of multiple baldder infections and her dr has order for MRI next week for this

## 2013-12-07 NOTE — ED Notes (Signed)
IStat Lactic Acid shown to Dr Freida Busman and Arline Asp, RN

## 2013-12-07 NOTE — ED Notes (Signed)
Rt lower abd pain since yesterday  And has gotten worse to the point where she gould not get off commode this am  abd is tender and  Pain is now all over.pt states took and oxycodone this am but it did not help. ems was called pt has 22 rt ac

## 2013-12-07 NOTE — ED Notes (Signed)
Family at bedside assissting w/ drinking contrast

## 2013-12-07 NOTE — ED Notes (Signed)
Given a happy meal. Pt states she isn't really hungry but advised she needed to eat something. Pt ate applesauce in bag

## 2013-12-07 NOTE — Progress Notes (Signed)
Fentanyl patch, 50 mcg/hr, noted behind patients right shoulder

## 2013-12-07 NOTE — ED Provider Notes (Signed)
CSN: [id number]     Arrival date & time 12/07/13  7829 History   First MD Initiated Contact with Patient 12/07/13 [phone]     Chief Complaint  Patient presents with  . Abdominal Pain   (Consider location/radiation/quality/duration/timing/severity/associated sxs/prior Treatment) Patient is a 79 y.o. female presenting with abdominal pain. The history is provided by the patient and a relative.  Abdominal Pain  a 79 year old female presents with acute onset of right lower quadrant abdominal pain that began this morning. Pain characterized as cramping and radiates to her left lower quadrant. Recently was treated for urinary tract infection. No reported fever or chills. Some nausea without vomiting. Pain became so severe that she fell to her knees. Denies any head injury associated with that. No recent bloody urine. Denies any blood in her stools. Does have problems with constipation. Took oxycodone prior to arrival which did not help. History of hysterectomy with possible appendectomy.  Past Medical History  Diagnosis Date  . Coronary artery disease   . Hypertension   . Atrial fibrillation   . Hypothyroidism   . Anxiety   . Hypothyroidism   . Anxiety   . Overactive bladder   . Renal cyst   . IBS (irritable bowel syndrome)   . Diverticulosis   . Osteoarthritis   . Renal disease   . Adenomatous polyp   . Osteoporosis    Past Surgical History  Procedure Laterality Date  . Back surgery    . Rotator cuff repair      Right  . Knee surgery      Right  . Abdominal hysterectomy    . Appendectomy    . Cataract extraction      Bilateral   Family History  Problem Relation Age of Onset  . Diabetes Mother   . Diabetes Brother   . Diabetes Brother    History  Substance Use Topics  . Smoking status: Never Smoker   . Smokeless tobacco: Not on file  . Alcohol Use: No   OB History   Grav Para Term Preterm Abortions TAB SAB Ect Mult Living                 Review of Systems   Gastrointestinal: Positive for abdominal pain.  All other systems reviewed and are negative.    Allergies  Sulfa antibiotics  Home Medications   Current Outpatient Rx  Name  Route  Sig  Dispense  Refill  . ALPRAZolam (XANAX) 0.25 MG tablet   Oral   Take 0.25 mg by mouth at bedtime as needed. For anxiety         . diltiazem (DILACOR XR) 120 MG 24 hr capsule   Oral   Take 120 mg by mouth daily.         . DULoxetine (CYMBALTA) 30 MG capsule   Oral   Take 30 mg by mouth 2 (two) times daily.         . fentaNYL (DURAGESIC - DOSED MCG/HR) 50 MCG/HR   Transdermal   Place 1 patch onto the skin every 3 (three) days.         . furosemide (LASIX) 20 MG tablet   Oral   Take 40 mg by mouth daily.         Marland Kitchen levothyroxine (SYNTHROID, LEVOTHROID) 88 MCG tablet   Oral   Take 88 mcg by mouth daily.         Marland Kitchen losartan (COZAAR) 50 MG tablet   Oral   Take  50 mg by mouth daily.         . Potassium Chloride CR (MICRO-K) 8 MEQ CPCR capsule CR   Oral   Take 8 mEq by mouth daily.         . predniSONE (DELTASONE) 5 MG tablet   Oral   Take 5 mg by mouth 2 (two) times daily.         Marland Kitchen warfarin (COUMADIN) 1 MG tablet      Please hold Coumadin until seen at the Coumadin clinic on 07/08/2012. At that visit he will be instructed when to resume your Coumadin   45 tablet   11    BP 133/94  Pulse 109  Temp(Src) 97.6 F (36.4 C) (Oral)  Resp 20  SpO2 100% Physical Exam  Nursing note and vitals reviewed. Constitutional: She is oriented to person, place, and time. She appears well-developed and well-nourished.  Non-toxic appearance. No distress.  HENT:  Head: Normocephalic and atraumatic.  Eyes: Conjunctivae, EOM and lids are normal. Pupils are equal, round, and reactive to light.  Neck: Normal range of motion. Neck supple. No tracheal deviation present. No mass present.  Cardiovascular: Regular rhythm and normal heart sounds.  Tachycardia present.  Exam reveals no  gallop.   No murmur heard. Pulmonary/Chest: Effort normal and breath sounds normal. No stridor. No respiratory distress. She has no decreased breath sounds. She has no wheezes. She has no rhonchi. She has no rales.  Abdominal: Soft. Normal appearance and bowel sounds are normal. She exhibits no distension. There is tenderness in the right lower quadrant, suprapubic area and left lower quadrant. There is guarding. There is no rigidity, no rebound and no CVA tenderness.  Musculoskeletal: Normal range of motion. She exhibits no edema and no tenderness.  Neurological: She is alert and oriented to person, place, and time. She has normal strength. No cranial nerve deficit or sensory deficit. GCS eye subscore is 4. GCS verbal subscore is 5. GCS motor subscore is 6.  Skin: Skin is warm and dry. No abrasion and no rash noted.  Psychiatric: She has a normal mood and affect. Her speech is normal and behavior is normal.    ED Course  Procedures (including critical care time) Labs Review Labs Reviewed  URINE CULTURE  CBC WITH DIFFERENTIAL  COMPREHENSIVE METABOLIC PANEL  LIPASE, BLOOD  URINALYSIS, ROUTINE W REFLEX MICROSCOPIC  PROTIME-INR   Imaging Review No results found.  EKG Interpretation   None       MDM  No diagnosis found. Patient given pain medication and she feels better. CT scan results noted. Fresh frozen plasma has been ordered. Discussed the case with the critical care doctor and he will come to admit the patient   Toy Baker, MD 12/07/13 1040

## 2013-12-07 NOTE — H&P (Signed)
PULMONARY  / CRITICAL CARE MEDICINE  Name: Linda Avila MRN: [id number] DOB: 1928/03/02    ADMISSION DATE:  12/07/2013 CONSULTATION DATE:  12/07/2013  REFERRING MD :  Dr. Tyson Alias PRIMARY SERVICE: PCCM  CHIEF COMPLAINT:  Abdominal Pain  BRIEF PATIENT DESCRIPTION:  79 year old Caucasian female with CAD, HTN, Chronic AFIB on Coumadin, Hypothyroidism, Anxiety, Overactive Baldder, Renal Cyst, IBS, Diverticulosis, CKD, and Osteoporosis being evaluated for abdominal pain and PCC is being asked to manage the patient for shock, abdominal wall hematoma, UTI, and Hypercoagulopathy.  SIGNIFICANT EVENTS / STUDIES:  12/09 UA >>> Dirty urine in setting of chronic, repeated UTI 12/09 CT Abdomen >>> Rectus sheet hematoma 12/09 PT/INR >>> INR at 4.01 and Hgb 10.2, baseline at 12.1 12/09 Lactic Acid Level at 6.06 12/09 4 units of FFP given and 5mg  of Vitamin K given IV  LINES / TUBES: Peripheral IVs 12/09 >>>  CULTURES: 12/06 MRSA PCR >>> Negative 12/06 Urine culture >>>  ANTIBIOTICS: Rocephin 12/09 >>> 12/09 Ceftazidime 12/09 >>>  HISTORY OF PRESENT ILLNESS:  79 year old Caucasian female with CAD, HTN, Chronic AFIB on Coumadin, Hypothyroidism, Anxiety, Overactive Baldder, Renal Cyst, IBS, Diverticulosis, CKD, and Osteoporosis being evaluated for abdominal pain and PCC is being asked to manage the patient for shock, abdominal wall hematoma, UTI, and Hypercoagulopathy.  According to family, patient, and EMR notes, the patient was in [place] on vacation for 1 month and was put on Keflex for a UTI and her INR and Coumadin was not adjusted like usual.  She was found by her husband in the bathroom this morning.  He stated that she did not loose conscious no pass out.  She was down in response to her abdominal pain that has been going on and was worsened this morning.  She described the pain as cramping and radiates to her left lower quadrant.  She went down to her knee due to this pain as well as feeling weak  this morning.   In the ED, initial exam was positive for abdominal pain as well as on ROS.  Blood work reveal INR at 4.01, acute anemia with hgb at 10.2 from 12.1 at baseline, Lactic Acid was 6.06, and has rectus sheet hematoma on CT scan of the abdomen when evaluated for her abdominal pain.  She was seen and examined in ED with family at bedside.  She has no specific complaints and her abdominal pain has improved since she had been to the ED.  4 units of FFP, 5mg  of Vitamin K was given in the ED by PCCM.  She will be admitted to the ICU for further management.   PAST MEDICAL HISTORY :  Past Medical History  Diagnosis Date  . Coronary artery disease   . Hypertension   . Atrial fibrillation   . Hypothyroidism   . Anxiety   . Hypothyroidism   . Anxiety   . Overactive bladder   . Renal cyst   . IBS (irritable bowel syndrome)   . Diverticulosis   . Osteoarthritis   . Renal disease   . Adenomatous polyp   . Osteoporosis    Past Surgical History  Procedure Laterality Date  . Back surgery    . Rotator cuff repair      Right  . Knee surgery      Right  . Abdominal hysterectomy    . Appendectomy    . Cataract extraction      Bilateral   Prior to Admission medications  Medication Sig Start Date End Date Taking? Authorizing Provider  ALPRAZolam (XANAX) 0.25 MG tablet Take 0.25 mg by mouth at bedtime as needed. For anxiety   Yes Historical Provider, MD  diltiazem (DILACOR XR) 120 MG 24 hr capsule Take 120 mg by mouth daily.   Yes Historical Provider, MD  DULoxetine (CYMBALTA) 30 MG capsule Take 30 mg by mouth 2 (two) times daily.   Yes Historical Provider, MD  fentaNYL (DURAGESIC - DOSED MCG/HR) 50 MCG/HR Place 1 patch onto the skin every 3 (three) days. 10/10/13  Yes Shayne Alken, MD  furosemide (LASIX) 20 MG tablet Take 40 mg by mouth daily.   Yes Historical Provider, MD  levothyroxine (SYNTHROID, LEVOTHROID) 88 MCG tablet Take 88 mcg by mouth daily.   Yes Historical Provider, MD   losartan (COZAAR) 50 MG tablet Take 50 mg by mouth daily.   Yes Historical Provider, MD  oxybutynin (OXYTROL) 3.9 MG/24HR Place 1 patch onto the skin every 3 (three) days. Pt removed patch yesterday 12/06/13   Yes Historical Provider, MD  Potassium Chloride CR (MICRO-K) 8 MEQ CPCR capsule CR Take 8 mEq by mouth daily.   Yes Historical Provider, MD  predniSONE (DELTASONE) 5 MG tablet Take 5 mg by mouth 2 (two) times daily.   Yes Historical Provider, MD  warfarin (COUMADIN) 1 MG tablet Please hold Coumadin until seen at the Coumadin clinic on 07/08/2012. At that visit he will be instructed when to resume your Coumadin 07/07/12  Yes Russella Dar, NP  oxyCODONE-acetaminophen (PERCOCET/ROXICET) 5-325 MG per tablet Take 1 tablet by mouth daily as needed for severe pain. For pain 11/04/13   Historical Provider, MD   Allergies  Allergen Reactions  . Sulfa Antibiotics Other (See Comments)    unknown    FAMILY HISTORY:  Family History  Problem Relation Age of Onset  . Diabetes Mother   . Diabetes Brother   . Diabetes Brother    SOCIAL HISTORY:  reports that she has never smoked. She does not have any smokeless tobacco history on file. She reports that she does not drink alcohol or use illicit drugs.  REVIEW OF SYSTEMS:  Abdominal pain persist but has improved.  No other complaints on ROS.  SUBJECTIVE:  See HPI above  VITAL SIGNS: Temp:  [97.6 F (36.4 C)-98.5 F (36.9 C)] 98.5 F (36.9 C) (12/09 1054) Pulse Rate:  [89-109] 102 (12/09 1054) Resp:  [15-20] 15 (12/09 1133) BP: (106-142)/(51-94) 125/92 mmHg (12/09 1133) SpO2:  [93 %-100 %] 93 % (12/09 1133) HEMODYNAMICS:   VENTILATOR SETTINGS:   INTAKE / OUTPUT: Intake/Output     12/08 0701 - 12/09 0700 12/09 0701 - 12/10 0700   I.V.  500   Total Intake   500   Net   +500          PHYSICAL EXAMINATION: General:  Well developed well nourished elderly female lying in bed in no acute distress Neuro:  Non focal, no apparent  deficit, AAOx3 HEENT:  NCAT, dry hair texture, EOMI, PERRLA, no eye/ear discharge Cardiovascular:  Sinus tachycardia, s1 and s2 heard, no s3 nor s4 appreciated, no murmur/rub/gallop Lungs:  CTAB, no wheeze, no rhonchi, no cough Abdomen:  Slight distended, midline scar from hysterectomy, mild tenderness to palpation Musculoskeletal:  Intact, appropriated strength, palpable and equal pulses Skin:  Intact, no ulcer, no lesion, no echymosis, warm and dry to touch  LABS:  CBC  Recent Labs Lab 12/07/13 0750  WBC 22.3*  HGB 10.2*  HCT  31.8*  PLT 244   Coag's  Recent Labs Lab 12/07/13 0750  INR 4.01*   BMET  Recent Labs Lab 12/07/13 0750  NA 141  K 4.7  CL 104  CO2 20  BUN 35*  CREATININE 1.48*  GLUCOSE 279*   Electrolytes  Recent Labs Lab 12/07/13 0750  CALCIUM 8.7   Sepsis Markers  Recent Labs Lab 12/07/13 0802  LATICACIDVEN 6.06*   ABG No results found for this basename: PHART, PCO2ART, PO2ART,  in the last 168 hours Liver Enzymes  Recent Labs Lab 12/07/13 0750  AST 17  ALT 14  ALKPHOS 77  BILITOT 0.5  ALBUMIN 2.7*   Cardiac Enzymes No results found for this basename: TROPONINI, PROBNP,  in the last 168 hours Glucose No results found for this basename: GLUCAP,  in the last 168 hours  Imaging Ct Abdomen Pelvis W Contrast  12/07/2013   CLINICAL DATA:  Abdominal pain.  On anticoagulants.  Possible fall.  EXAM: CT ABDOMEN AND PELVIS WITH CONTRAST  TECHNIQUE: Multidetector CT imaging of the abdomen and pelvis was performed using the standard protocol following bolus administration of intravenous contrast.  CONTRAST:  80mL OMNIPAQUE IOHEXOL 300 MG/ML  SOLN  COMPARISON:  None.  FINDINGS: There is a large right-sided rectus sheath hematoma. This measures 8.3 cm transversely x 4.7 cm AP x 11.4 cm longitudinally. There is extension of blood inferiorly along the fascial planes and there is also some blood within the peritoneal cavity of the pelvis. There is  no evidence of a subcapsular hemorrhage of the liver or spleen or kidneys. The kidneys demonstrate multiple hypodensities which may reflect cysts. One hypodensity in the lower pole of the right kidney measures 3.2 cm in greatest dimension and has Hounsfield measurment of +7. On the left a midpole cystic appearing structure measures 3.7 x 2.1 cm and exhibits Hounsfield measurement of +2. There is no hyperdense urine demonstrated. The perinephric fat is normal in density. The caliber of the abdominal aorta is normal. There are subcentimeter hypodensities in the liver which likely reflects cysts. There is a small amount of intermediate density fluid surrounding the liver. It has Hounsfield measurement of +27. The gallbladder, pancreas, spleen, partially distended stomach, and adrenal glands are normal in appearance. The partially contrast filled loops of small bowel appear normal. The colon demonstrates a normal stool and gas pattern.  The lumbar vertebral bodies are preserved in height. There is sclerosis of the parasymphyseal portion of the right superior pubic ramus weeks with an adjacent line. This may be old but correlation with symptoms here is needed. I do not see pelvic fractures elsewhere. The psoas musculature appears normal. The lung bases are clear.  IMPRESSION: 1. There is an acute right-sided rectus sheath hematoma with dimensions as given above. There is a small amount fluid surrounding the liver, and within the pelvis high density fluid in the retroperitoneum and within the peritoneal cavity having extended from the inferior aspect of the rectus sheath. 2. There is no evidence of a subcapsular hemorrhage of the liver or spleen or kidneys. There are multiple hypodensities within the kidneys likely reflecting cysts but this could be confirmed with ultrasound when the patient has recovered from her acute injury. 3. There is no acute bowel abnormality. The abdominal aorta exhibits no evidence of an aneurysm  or leakage of blood. 4. There is irregularity of the parasymphyseal portion of the right superior pubic ramus which appears reflect a fracture which is likely old given the surrounding sclerosis  present. No acute pelvic fracture or lumbar spine fracture otherwise is demonstrated. 5. There is no evidence of a pleural effusion nor of a basilar pneumothorax. 6. These results were called by me by telephone at the time of interpretation on 12/07/2013 at 10:25 AM to Dr. Lorre Nick , who verbally acknowledged these results.   Electronically Signed   By: David  [place]   On: 12/07/2013 10:27   Dg Abd Acute W/chest  12/07/2013   CLINICAL DATA:  Abdominal pain  EXAM: ACUTE ABDOMEN SERIES (ABDOMEN 2 VIEW & CHEST 1 VIEW)  COMPARISON:  10/09/2013  FINDINGS: Cardiac shadow is stable. The lungs are clear bilaterally. The abdomen shows a nonobstructive bowel gas pattern. Contrast material from a day upcoming CT is noted. Fecal material is noted throughout the colon. No free air is seen. No abnormal mass is noted.  IMPRESSION: Nonspecific chest and abdomen.   Electronically Signed   By: Alcide Clever M.D.   On: 12/07/2013 10:43     CXR: 12/09 >>> No acute process, normal CXR  ASSESSMENT / PLAN:  PULMONARY A: No Acute nor Chronic Problem  P:   - [place] as needed for oxygenation - No other anticipated problem to manage-follow for edema from FFP  CARDIOVASCULAR A:  Chronic Atrial Fibrillation  --> Currently in AFIB with borderline rate in the 110s  --> Chronic Coumadin now with INR 4.01  --> s/p 4 Units of FFP and 5mg  IV of Vitamin K  --> On Diltiazem ER 120mg  QD for rate control at home Hypertension by History  --> On Diltiazem, Lasix, KCl, and Losartan at home  --> Borderline BP at this time CAD  --> on ARB at home Adrenal insuff (pred dep) P:  - Will hold Diltiazem for now despite borderline HR, concern borderline BP - Monitor HR with IVF first then consider rate control once resuscitated - Hold Lasix  and Losartan  - Monitor blood pressure - Hold Coumadin and anticoagulation, likely INR rise with ABX, but inr was only 4 when bleed developed, willneed cards evaluation in future - Provide SCDs -if after ffp (volume) BP improved, will consider dilt oral -low threshold line  -stress roids  RENAL A:   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  --> Current Scr at 1.48, 1.1-1.2 at baseline  --> Likely due to developing Sepsis and dehydration Lactic Acidosis  --> Lactic acid level of 6.06 P:   - Aggressive IVF replacement - Monitor Lactic repeat - Might need central line -chem in am -avoid gross overload, resuscitate with blood when indicated and not gross volume  GASTROINTESTINAL A:   No Acute Process Diverticulosis Colon Polyps Rectal Sheet Hematoma  P:   - Monitor CBC - no surgical needed, INR correction is plan -diet  HEMATOLOGIC A:   Hypercoagulopathy  --> INR at 4.10  --> s/p 4x FFB and Vitamin K 5mg  Leukocytosis  --> WBC at 22.3 Acute Blood Loss Amemia  --> Hbg at 10.2 from 12.1 baseline  --> CT abdomen with rectus sheet hematoma P:  - INR/APTT after FFP - CBC Q6H for monitoring - Tranfuse if Hgb < 7g/dL - See infectious for leukocytosis -scd  INFECTIOUS A:   Leukocytosis, WBC at 22.3 Urinary Tract Infection  --> History of repeated chronic UTI  --> Mcheduled for MRI for bladder evaluation next Tuesday by Dr. Patsi Sears  --> UA on admission was dirty  --> Rocephin given in the ED, recently taken 1 course of Keflex Sepsis  --> 2/4 SIRS with dirty  UA P:   - Urine culture sent - Switch antibiotic to Ceftazidime (prior abx exposure frequent) - IVFs - Stress steroid with Solu Cortef at 50mg  Q6H  ENDOCRINE/RHEUMATOLOGY A:  Hypothyroidism on T4 at home Polymyalgia Rheumatica - 5mg  Prednisone daily at home Adrenal insuff Elevated Blood Glucose  --> No history of DM  --> Likely secondary to chronic steroid for PMR P:   - Check TSH - Restarted home dose Synthroid -  Stop Prednisone home dose once off IV steroids, stress provided - Solu Cortef 50mg  Q6H for stress steroid - Restart home Cymbalta  NEUROLOGIC A:   No Acute Process At Risk for Delirium in ICU  P:   - Monitor neurological function  TODAY'S SUMMARY: Rectal sheet hematoma in setting of hypercoagulopathy states due to chronic Coumadin use for chronic AFIB.  Lactic elevation out of proportion to blood loss suggesting underlying process and is suspecting to have developing sepsis with UTI.  Hold anticogulation and reverse coagulopathy stat.  Monitor CBC, PT/INR, and Lactic Acid.  IVF, antibiotic, and stress steroid.  Will treat Afib if fluids resuscitation does not resolved tachycardia.  I have personally obtained a history, examined the patient, evaluated laboratory and imaging results, formulated the assessment and plan and placed orders.  CRITICAL CARE: The patient is critically ill with multiple organ systems failure and requires high complexity decision making for assessment and support, frequent evaluation and titration of therapies, application of advanced monitoring technologies and extensive interpretation of multiple databases. Critical Care Time devoted to patient care services described in this note is 45  minutes.   Mcarthur Rossetti. Tyson Alias, MD, FACP Pgr: [phone]  Pulmonary & Critical Care  Pulmonary and Critical Care Medicine [facility] Pager: [phone]  12/07/2013, 11:40 AM

## 2013-12-07 NOTE — ED Notes (Signed)
CBG check of 141

## 2013-12-07 NOTE — ED Notes (Signed)
Steve, CCNP at the bedside.  

## 2013-12-07 NOTE — Progress Notes (Signed)
UR Completed.  [name] [phone] 12/07/2013  

## 2013-12-07 NOTE — Progress Notes (Signed)
ANTIBIOTIC CONSULT NOTE - INITIAL  Pharmacy Consult for ceftazidime Indication: UTI  Allergies  Allergen Reactions  . Sulfa Antibiotics Other (See Comments)    unknown    Patient Measurements: Height: 5\' 6"  (167.6 cm) Weight: 166 lb 0.1 oz (75.3 kg) IBW/kg (Calculated) : 59.3  Vital Signs: Temp: 98.5 F (36.9 C) (12/09 1054) Temp src: Oral (12/09 1054) BP: 125/92 mmHg (12/09 1133) Pulse Rate: 102 (12/09 1054) Intake/Output from previous day:   Intake/Output from this shift: Total I/O In: 798 [I.V.:500; Blood:298] Out: -   Labs:  Recent Labs  12/07/13 0750  WBC 22.3*  HGB 10.2*  PLT 244  CREATININE 1.48*   Estimated Creatinine Clearance: 29.9 ml/min (by C-G formula based on Cr of 1.48). No results found for this basename: VANCOTROUGH, VANCOPEAK, VANCORANDOM, GENTTROUGH, GENTPEAK, GENTRANDOM, TOBRATROUGH, TOBRAPEAK, TOBRARND, AMIKACINPEAK, AMIKACINTROU, AMIKACIN,  in the last 72 hours   Microbiology: No results found for this or any previous visit (from the past 720 hour(s)).  Medical History: Past Medical History  Diagnosis Date  . Coronary artery disease   . Hypertension   . Atrial fibrillation   . Hypothyroidism   . Anxiety   . Hypothyroidism   . Anxiety   . Overactive bladder   . Renal cyst   . IBS (irritable bowel syndrome)   . Diverticulosis   . Osteoarthritis   . Renal disease   . Adenomatous polyp   . Osteoporosis     Medications:  See PTA medication list  Assessment: 6 YOF brought in with abdominal pain with dirty UA to start ceftazidime for UTI. Blood and urine cultures have been sent. WBC 22.3, currently afebrile. SCr 1.48 with est CrCl ~33mL/min.  Goal of Therapy:  Eradication of infection  Plan:  1. Ceftazidime 1g IV q12h- if SCr increases more, will need to switch to q24h dosing 2. Follow up c/s, WBC, fever curve, clinical progression, LOT, renal function  Tyshana Nishida D. Talaya Lamprecht, PharmD, BCPS Clinical Pharmacist Pager:  [phone] 12/07/2013 11:59 AM

## 2013-12-07 NOTE — ED Notes (Signed)
Dr Freida Busman awate of lactic acid result

## 2013-12-07 NOTE — ED Notes (Signed)
Dr. Feinstein at the bedside.  

## 2013-12-08 DIAGNOSIS — D649 Anemia, unspecified: Secondary | ICD-10-CM | POA: Diagnosis present

## 2013-12-08 DIAGNOSIS — Z5189 Encounter for other specified aftercare: Secondary | ICD-10-CM

## 2013-12-08 LAB — CBC
HCT: 19.6 % — ABNORMAL LOW (ref 36.0–46.0)
Hemoglobin: 6.5 g/dL — CL (ref 12.0–15.0)
Hemoglobin: 8 g/dL — ABNORMAL LOW (ref 12.0–15.0)
MCH: 31 pg (ref 26.0–34.0)
MCH: 30.5 pg (ref 26.0–34.0)
MCHC: 33.2 g/dL (ref 30.0–36.0)
MCHC: 33.3 g/dL (ref 30.0–36.0)
MCV: 93.3 fL (ref 78.0–100.0)
Platelets: 159 10*3/uL (ref 150–400)
RBC: 2.1 MIL/uL — ABNORMAL LOW (ref 3.87–5.11)
RBC: 2.62 MIL/uL — ABNORMAL LOW (ref 3.87–5.11)
RDW: 17.4 % — ABNORMAL HIGH (ref 11.5–15.5)
RDW: 16.7 % — ABNORMAL HIGH (ref 11.5–15.5)

## 2013-12-08 LAB — PREPARE RBC (CROSSMATCH)

## 2013-12-08 LAB — PREPARE FRESH FROZEN PLASMA
Unit division: 0
Unit division: 0
Unit division: 0
Unit division: 0

## 2013-12-08 LAB — BASIC METABOLIC PANEL
BUN: 26 mg/dL — ABNORMAL HIGH (ref 6–23)
Chloride: 104 mEq/L (ref 96–112)
GFR calc Af Amer: 40 mL/min — ABNORMAL LOW (ref >90)
Glucose, Bld: 153 mg/dL — ABNORMAL HIGH (ref 70–99)
Potassium: 3.7 mEq/L (ref 3.5–5.1)
Sodium: 140 mEq/L (ref 135–145)

## 2013-12-08 LAB — TSH: TSH: 3.313 u[IU]/mL (ref 0.350–4.500)

## 2013-12-08 LAB — LACTIC ACID, PLASMA
Lactic Acid, Venous: 0.9 mmol/L (ref 0.5–2.2)
Lactic Acid, Venous: 0.8 mmol/L (ref 0.5–2.2)

## 2013-12-08 LAB — GLUCOSE, CAPILLARY
Glucose-Capillary: 129 mg/dL — ABNORMAL HIGH (ref 70–99)
Glucose-Capillary: 247 mg/dL — ABNORMAL HIGH (ref 70–99)

## 2013-12-08 LAB — PROTIME-INR: INR: 1.1 (ref 0.00–1.49)

## 2013-12-08 LAB — APTT: aPTT: 33 seconds (ref 24–37)

## 2013-12-08 MED ORDER — OXYCODONE-ACETAMINOPHEN 5-325 MG PO TABS
1.0000 | ORAL_TABLET | Freq: Every day | ORAL | Status: DC | PRN
  Administered 2013-12-08 – 2013-12-09 (×2): 1 via ORAL
  Filled 2013-12-08 (×2): qty 1

## 2013-12-08 MED ORDER — FENTANYL 25 MCG/HR TD PT72
50.0000 ug | MEDICATED_PATCH | TRANSDERMAL | Status: DC
  Administered 2013-12-08: 50 ug via TRANSDERMAL
  Filled 2013-12-08: qty 1

## 2013-12-08 MED ORDER — LEVOTHYROXINE SODIUM 88 MCG PO TABS
88.0000 ug | ORAL_TABLET | Freq: Every day | ORAL | Status: DC
  Administered 2013-12-08 – 2013-12-10 (×3): 88 ug via ORAL
  Filled 2013-12-08 (×5): qty 1

## 2013-12-08 MED ORDER — PREDNISONE 5 MG PO TABS
5.0000 mg | ORAL_TABLET | Freq: Two times a day (BID) | ORAL | Status: DC
  Administered 2013-12-08 – 2013-12-10 (×5): 5 mg via ORAL
  Filled 2013-12-08 (×8): qty 1

## 2013-12-08 MED ORDER — ALPRAZOLAM 0.25 MG PO TABS
0.2500 mg | ORAL_TABLET | Freq: Every evening | ORAL | Status: DC | PRN
  Administered 2013-12-09: 0.25 mg via ORAL
  Filled 2013-12-08 (×2): qty 1

## 2013-12-08 MED ORDER — DULOXETINE HCL 30 MG PO CPEP
30.0000 mg | ORAL_CAPSULE | Freq: Two times a day (BID) | ORAL | Status: DC
  Administered 2013-12-08 – 2013-12-10 (×5): 30 mg via ORAL
  Filled 2013-12-08 (×6): qty 1

## 2013-12-08 NOTE — Progress Notes (Signed)
Fentanyl 50 mcg/hr patch was removed from right side of back and placed in black bin. New patch placed on left upper arm.  Zenovia Jordan, RN

## 2013-12-08 NOTE — Progress Notes (Signed)
PULMONARY  / CRITICAL CARE MEDICINE  Name: Linda Avila MRN: [id number] DOB: 1928/01/13    ADMISSION DATE:  12/07/2013 CONSULTATION DATE:  12/07/2013  REFERRING MD :  Dr. Tyson Alias PRIMARY SERVICE: PCCM  CHIEF COMPLAINT:  Abdominal Pain  BRIEF PATIENT DESCRIPTION:  79 year old Caucasian female with CAD, HTN, Chronic AFIB on Coumadin, Hypothyroidism, Anxiety, Overactive Baldder, Renal Cyst, IBS, Diverticulosis, CKD, and Osteoporosis being evaluated for abdominal pain and PCC is being asked to manage the patient for shock, abdominal wall hematoma, UTI, and Hypercoagulopathy.  SIGNIFICANT EVENTS / STUDIES:  12/09 UA >>> Dirty urine in setting of chronic, repeated UTI 12/09 CT Abdomen >>> Rectus sheet hematoma 12/09 PT/INR >>> INR at 4.01 and Hgb 10.2, baseline at 12.1 12/09 Lactic Acid Level at 6.06 12/09 4 units of FFP given and 5mg  of Vitamin K given IV 12/10 Much improved. Transfer to Tele bed. Cards consult for AF  LINES / TUBES:   CULTURES: 12/06 MRSA PCR >>> Negative 12/06 Urine culture >>   ANTIBIOTICS: Rocephin 12/09 >>> 12/09 Ceftazidime 12/09 >>    SUBJECTIVE:  No distress, no complaints. Minimal abdominal pain  VITAL SIGNS: Temp:  [97.2 F (36.2 C)-98.7 F (37.1 C)] 98.7 F (37.1 C) (12/10 1200) Pulse Rate:  [79-111] 109 (12/10 1400) Resp:  [11-28] 19 (12/10 1400) BP: (120-192)/(38-83) 151/59 mmHg (12/10 1400) SpO2:  [95 %-100 %] 99 % (12/10 1400) Weight:  [75.6 kg (166 lb 10.7 oz)] 75.6 kg (166 lb 10.7 oz) (12/10 0500) HEMODYNAMICS:   VENTILATOR SETTINGS:   INTAKE / OUTPUT: Intake/Output     12/09 0701 - 12/10 0700 12/10 0701 - 12/11 0700   I.V. (mL/kg) 2401.3 (31.8) 263 (3.5)   Blood 1097    IV Piggyback 100 50   Total Intake(mL/kg) 3598.3 (47.6) 313 (4.1)   Urine (mL/kg/hr) 750 250 (0.4)   Total Output 750 250   Net +2848.3 +63        Urine Occurrence 2 x      PHYSICAL EXAMINATION: General:  WDWN, NAD Neuro:  Non focal HEENT:   WNL Cardiovascular: RRR s M Lungs:  Clear Abdomen:  Nondistended, minimally tender on R side, +BS Ext: warm, no edema  LABS: I have reviewed all of today's lab results. Relevant abnormalities are discussed in the A/P section   CXR: NNF   ASSESSMENT / PLAN:   CARDIOVASCULAR A:  Chronic Paroxysmal AF NSR this AM H/O hypertension  H/O CAD P:  Holding anti-hypertensives Monitor  Courtesy consult requested of Cards  RENAL A:   AKI. Resolved CKD stage I/II Lactic Acidosis, resolved P:   Monitor BMET intermittently Correct electrolytes as indicated DC IVFs 12/10  GASTROINTESTINAL A:   Rectus Sheath Hematoma  P:     HEMATOLOGIC A:  Acute Blood Loss Anemia Warfarin induced coagulopathy - reversed No evidence of ongoing active bleeding P:  Monitor CBC intermittently Monitor coags intermittently  INFECTIOUS A:   Leukocytosis Urinary Tract Infection Sepsis, resolved P:   Micro and abx as above  ENDOCRINE/RHEUMATOLOGY A:  Hypothyroidism  Polymyalgia Rheumatica - 5mg  Prednisone BID  Chronic steroids - presuemd adrenal insuff Elevated Blood Glucose, minimal   P:   Cont synthroid DC HC. Resume home dose of prednisone    Billy Fischer, MD ; [facility] [phone].  After 5:30 PM or weekends, call [phone]

## 2013-12-08 NOTE — Consult Note (Signed)
Admit date: 12/07/2013 Referring Physician  Dr. Sung Amabile Primary Physician  Dr. Pete Glatter Primary Cardiologist  Dr. Anne Fu Reason for Consultation  anticoagulation management  HPI: 79 year old woman who had a rectus sheath hematoma. She has had significant anemia.  She is on chronic Coumadin for atrial fibrillation. She took an antibiotic while in [place]. This likely made her INR supratherapeutic and she had a rectus sheath bleed. She has had issues with high INRs in the past. She takes antibiotics frequently due to urinary tract infections. Currently, she denies any palpitations, chest pain or shortness of breath. She is hoping to go home soon. She does try to take her medications as prescribed on a regular basis.     PMH:   Past Medical History  Diagnosis Date  . Coronary artery disease   . Hypertension   . Atrial fibrillation   . Hypothyroidism   . Anxiety   . Hypothyroidism   . Anxiety   . Overactive bladder   . Renal cyst   . IBS (irritable bowel syndrome)   . Diverticulosis   . Osteoarthritis   . Renal disease   . Adenomatous polyp   . Osteoporosis      PSH:   Past Surgical History  Procedure Laterality Date  . Back surgery    . Rotator cuff repair      Right  . Knee surgery      Right  . Abdominal hysterectomy    . Appendectomy    . Cataract extraction      Bilateral    Allergies:  Sulfa antibiotics Prior to Admit Meds:   Prescriptions prior to admission  Medication Sig Dispense Refill  . ALPRAZolam (XANAX) 0.25 MG tablet Take 0.25 mg by mouth at bedtime as needed. For anxiety      . diltiazem (DILACOR XR) 120 MG 24 hr capsule Take 120 mg by mouth daily.      . DULoxetine (CYMBALTA) 30 MG capsule Take 30 mg by mouth 2 (two) times daily.      . fentaNYL (DURAGESIC - DOSED MCG/HR) 50 MCG/HR Place 1 patch onto the skin every 3 (three) days.      . furosemide (LASIX) 20 MG tablet Take 40 mg by mouth daily.      Marland Kitchen levothyroxine (SYNTHROID, LEVOTHROID) 88 MCG  tablet Take 88 mcg by mouth daily.      Marland Kitchen losartan (COZAAR) 50 MG tablet Take 50 mg by mouth daily.      Marland Kitchen oxybutynin (OXYTROL) 3.9 MG/24HR Place 1 patch onto the skin every 3 (three) days. Pt removed patch yesterday 12/06/13      . Potassium Chloride CR (MICRO-K) 8 MEQ CPCR capsule CR Take 8 mEq by mouth daily.      . predniSONE (DELTASONE) 5 MG tablet Take 5 mg by mouth 2 (two) times daily.      Marland Kitchen warfarin (COUMADIN) 1 MG tablet Please hold Coumadin until seen at the Coumadin clinic on 07/08/2012. At that visit he will be instructed when to resume your Coumadin  45 tablet  11  . oxyCODONE-acetaminophen (PERCOCET/ROXICET) 5-325 MG per tablet Take 1 tablet by mouth daily as needed for severe pain. For pain       Fam HX:    Family History  Problem Relation Age of Onset  . Diabetes Mother   . Diabetes Brother   . Diabetes Brother    Social HX:    History   Social History  . Marital Status: Married    Spouse  Name: N/A    Number of Children: N/A  . Years of Education: N/A   Occupational History  . housewife    Social History Main Topics  . Smoking status: Never Smoker   . Smokeless tobacco: Not on file  . Alcohol Use: No  . Drug Use: No  . Sexual Activity: Not on file   Other Topics Concern  . Not on file   Social History Narrative  . No narrative on file     ROS:  All 11 ROS were addressed and are negative except what is stated in the HPI  Physical Exam: Blood pressure 153/61, pulse 106, temperature 97.9 F (36.6 C), temperature source Oral, resp. rate 20, height 5\' 5"  (1.651 m), weight 167 lb (75.751 kg), SpO2 100.00%.    General: Well developed, well nourished, in no acute distress Head: Eyes PERRLA, No xanthomas.   Normal cephalic and atramatic  Lungs:  Clear bilaterally to auscultation and percussion. Heart:  Regular, tachycardic S1 S2 Pulses are 2+ & equal.            No carotid bruit. No JVD.  No abdominal bruits. No femoral bruits. Abdomen: Bowel sounds are  positive, abdomen soft and non-tender without masses or                  Hernia's noted. Msk:  Back normal, normal gait. Normal strength and tone for age. Extremities:   No  edema.  DP +1 Neuro: Alert and oriented X 3. Psych: Normal affect, responds appropriately    Labs:   Lab Results  Component Value Date   WBC 15.6* 12/08/2013   HGB 8.0* 12/08/2013   HCT 24.0* 12/08/2013   MCV 91.6 12/08/2013   PLT 159 12/08/2013    Recent Labs Lab 12/07/13 0750 12/08/13 0209  NA 141 140  K 4.7 3.7  CL 104 104  CO2 20 27  BUN 35* 26*  CREATININE 1.48* 1.37*  CALCIUM 8.7 8.1*  PROT 5.2*  --   BILITOT 0.5  --   ALKPHOS 77  --   ALT 14  --   AST 17  --   GLUCOSE 279* 153*   No results found for this basename: PTT   Lab Results  Component Value Date   INR 1.10 12/08/2013   INR 2.95* 12/07/2013   INR 4.01* 12/07/2013   Lab Results  Component Value Date   TROPONINI <0.30 10/10/2013     Lab Results  Component Value Date   CHOL 156 10/10/2013   Lab Results  Component Value Date   HDL 65 10/10/2013   Lab Results  Component Value Date   LDLCALC 73 10/10/2013   Lab Results  Component Value Date   TRIG 88 10/10/2013   Lab Results  Component Value Date   CHOLHDL 2.4 10/10/2013   No results found for this basename: LDLDIRECT      Radiology:  Ct Abdomen Pelvis W Contrast  12/07/2013   CLINICAL DATA:  Abdominal pain.  On anticoagulants.  Possible fall.  EXAM: CT ABDOMEN AND PELVIS WITH CONTRAST  TECHNIQUE: Multidetector CT imaging of the abdomen and pelvis was performed using the standard protocol following bolus administration of intravenous contrast.  CONTRAST:  80mL OMNIPAQUE IOHEXOL 300 MG/ML  SOLN  COMPARISON:  None.  FINDINGS: There is a large right-sided rectus sheath hematoma. This measures 8.3 cm transversely x 4.7 cm AP x 11.4 cm longitudinally. There is extension of blood inferiorly along the fascial planes and there is  also some blood within the peritoneal cavity  of the pelvis. There is no evidence of a subcapsular hemorrhage of the liver or spleen or kidneys. The kidneys demonstrate multiple hypodensities which may reflect cysts. One hypodensity in the lower pole of the right kidney measures 3.2 cm in greatest dimension and has Hounsfield measurment of +7. On the left a midpole cystic appearing structure measures 3.7 x 2.1 cm and exhibits Hounsfield measurement of +2. There is no hyperdense urine demonstrated. The perinephric fat is normal in density. The caliber of the abdominal aorta is normal. There are subcentimeter hypodensities in the liver which likely reflects cysts. There is a small amount of intermediate density fluid surrounding the liver. It has Hounsfield measurement of +27. The gallbladder, pancreas, spleen, partially distended stomach, and adrenal glands are normal in appearance. The partially contrast filled loops of small bowel appear normal. The colon demonstrates a normal stool and gas pattern.  The lumbar vertebral bodies are preserved in height. There is sclerosis of the parasymphyseal portion of the right superior pubic ramus weeks with an adjacent line. This may be old but correlation with symptoms here is needed. I do not see pelvic fractures elsewhere. The psoas musculature appears normal. The lung bases are clear.  IMPRESSION: 1. There is an acute right-sided rectus sheath hematoma with dimensions as given above. There is a small amount fluid surrounding the liver, and within the pelvis high density fluid in the retroperitoneum and within the peritoneal cavity having extended from the inferior aspect of the rectus sheath. 2. There is no evidence of a subcapsular hemorrhage of the liver or spleen or kidneys. There are multiple hypodensities within the kidneys likely reflecting cysts but this could be confirmed with ultrasound when the patient has recovered from her acute injury. 3. There is no acute bowel abnormality. The abdominal aorta exhibits no  evidence of an aneurysm or leakage of blood. 4. There is irregularity of the parasymphyseal portion of the right superior pubic ramus which appears reflect a fracture which is likely old given the surrounding sclerosis present. No acute pelvic fracture or lumbar spine fracture otherwise is demonstrated. 5. There is no evidence of a pleural effusion nor of a basilar pneumothorax. 6. These results were called by me by telephone at the time of interpretation on 12/07/2013 at 10:25 AM to Dr. Lorre Nick , who verbally acknowledged these results.   Electronically Signed   By: David  [place]   On: 12/07/2013 10:27   Dg Abd Acute W/chest  12/07/2013   CLINICAL DATA:  Abdominal pain  EXAM: ACUTE ABDOMEN SERIES (ABDOMEN 2 VIEW & CHEST 1 VIEW)  COMPARISON:  10/09/2013  FINDINGS: Cardiac shadow is stable. The lungs are clear bilaterally. The abdomen shows a nonobstructive bowel gas pattern. Contrast material from a day upcoming CT is noted. Fecal material is noted throughout the colon. No free air is seen. No abnormal mass is noted.  IMPRESSION: Nonspecific chest and abdomen.   Electronically Signed   By: Alcide Clever M.D.   On: 12/07/2013 10:43    EKG:  Normal sinus rhythm, nonspecific ST-T wave changes  ASSESSMENT: Atrial fibrillation, chronic anticoagulation, rectus sheath hematoma  PLAN:  I had a long conversation with the patient and her family. I would strongly consider a novel oral anticoagulant to avoid the variations in INR associated with antibiotics. This is not her first episode of a supratherapeutic INR. The patient and family is in agreement with this plan. The key question will be when  it is safe to start anticoagulation due to her recent bleed. I would like her hemoglobin to stabilize prior to initiating any medication. Will discuss with Dr. Anne Fu. She is currently in normal sinus rhythm. Unfortunately, she did have some paroxysmal atrial fibrillation last night. We'll try to restart anticoagulation  as soon as possible.  Corky Crafts., MD  12/08/2013  6:05 PM

## 2013-12-08 NOTE — Progress Notes (Signed)
Pt a/o no c/o pain, pt was tachy in the 140's, pt asymptomatic, pt was getting up to bathroom, will continue to monitor

## 2013-12-08 NOTE — Progress Notes (Signed)
Inpatient Diabetes Program Recommendations  AACE/ADA: New Consensus Statement on Inpatient Glycemic Control (2013)  Target Ranges:  Prepandial:   less than 140 mg/dL      Peak postprandial:   less than 180 mg/dL (1-2 hours)      Critically ill patients:  140 - 180 mg/dL   Results for DAKSHA, KOONE (MRN [id number]) as of 12/08/2013 13:31  Ref. Range 12/07/2013 07:50 12/08/2013 02:09  Glucose Latest Range: 70-99 mg/dL 536 (H) 644 (H)  Results for CHENEL, WERNLI (MRN [id number]) as of 12/08/2013 13:35  Ref. Range 12/08/2013 08:09 12/08/2013 11:41  Glucose-Capillary Latest Range: 70-99 mg/dL 638 (H) 756 (H)    Inpatient Diabetes Program Recommendations Correction (SSI): Please consider ordering CBGs with Novolog correction ACHS while inpatient. HgbA1C: Please consider ordering an A1C to determine glycemic control over the past 2-3 months.  Note: Patient does not have a documented history of diabetes.  However, initial lab glucose was 279 mg/dl on 43/3 at 2:95.  Patient is currently ordered steroids and takes steroids as an outpatient on a chronic basis.  Please consider ordering CBGs with Novolog correction ACHS and ordering an A1C while inpatient.  Also, if appropriate, may want to add Carb Modified to current heart healthy diet.  Will continue to follow.  Thanks, Orlando Penner, RN, MSN, CCRN Diabetes Coordinator Inpatient Diabetes Program [phone] (Team Pager) [phone] (AP office) [phone] [facility] office)

## 2013-12-08 NOTE — Progress Notes (Signed)
eLink Physician-Brief Progress Note Patient Name: Linda Avila DOB: 02-22-1928 MRN: [id number]  Date of Service  12/08/2013   HPI/Events of Note  Hgb down from 7.1 to 6.5.  INR had been out at 1pm 10/9 at 2.95.  Currently pending.   eICU Interventions  Plan: Transfuse 1 U pRBC Evaluate INR for additional FFP.   Intervention Category Intermediate Interventions: Bleeding - evaluation and treatment with blood products  Ares Cardozo 12/08/2013, 2:49 AM

## 2013-12-09 DIAGNOSIS — T458X1A Poisoning by other primarily systemic and hematological agents, accidental (unintentional), initial encounter: Secondary | ICD-10-CM

## 2013-12-09 DIAGNOSIS — T45511A Poisoning by anticoagulants, accidental (unintentional), initial encounter: Secondary | ICD-10-CM

## 2013-12-09 LAB — URINE CULTURE: Colony Count: 30000

## 2013-12-09 LAB — CBC
Hemoglobin: 8.4 g/dL — ABNORMAL LOW (ref 12.0–15.0)
MCHC: 34.3 g/dL (ref 30.0–36.0)
Platelets: 170 10*3/uL (ref 150–400)
RBC: 2.69 MIL/uL — ABNORMAL LOW (ref 3.87–5.11)
WBC: 14.2 10*3/uL — ABNORMAL HIGH (ref 4.0–10.5)

## 2013-12-09 LAB — BASIC METABOLIC PANEL
CO2: 28 mEq/L (ref 19–32)
Chloride: 107 mEq/L (ref 96–112)
GFR calc Af Amer: 53 mL/min — ABNORMAL LOW (ref >90)
GFR calc non Af Amer: 46 mL/min — ABNORMAL LOW (ref >90)
Glucose, Bld: 121 mg/dL — ABNORMAL HIGH (ref 70–99)
Potassium: 3.3 mEq/L — ABNORMAL LOW (ref 3.5–5.1)
Sodium: 141 mEq/L (ref 135–145)

## 2013-12-09 LAB — TYPE AND SCREEN
ABO/RH(D): A POS
Antibody Screen: NEGATIVE

## 2013-12-09 MED ORDER — PNEUMOCOCCAL VAC POLYVALENT 25 MCG/0.5ML IJ INJ
0.5000 mL | INJECTION | INTRAMUSCULAR | Status: AC
  Administered 2013-12-10: 11:00:00 0.5 mL via INTRAMUSCULAR
  Filled 2013-12-09: qty 0.5

## 2013-12-09 MED ORDER — LOSARTAN POTASSIUM 50 MG PO TABS
50.0000 mg | ORAL_TABLET | Freq: Every day | ORAL | Status: DC
  Administered 2013-12-09 – 2013-12-10 (×2): 50 mg via ORAL
  Filled 2013-12-09 (×2): qty 1

## 2013-12-09 MED ORDER — OXYCODONE-ACETAMINOPHEN 5-325 MG PO TABS
1.0000 | ORAL_TABLET | ORAL | Status: DC | PRN
  Administered 2013-12-09 – 2013-12-10 (×3): 1 via ORAL
  Filled 2013-12-09 (×4): qty 1

## 2013-12-09 MED ORDER — POTASSIUM CHLORIDE CRYS ER 20 MEQ PO TBCR
20.0000 meq | EXTENDED_RELEASE_TABLET | Freq: Once | ORAL | Status: AC
  Administered 2013-12-09: 20 meq via ORAL
  Filled 2013-12-09: qty 1

## 2013-12-09 MED ORDER — DILTIAZEM HCL ER COATED BEADS 120 MG PO CP24
120.0000 mg | ORAL_CAPSULE | Freq: Every day | ORAL | Status: DC
  Administered 2013-12-09: 13:00:00 120 mg via ORAL
  Filled 2013-12-09 (×2): qty 1

## 2013-12-09 NOTE — Progress Notes (Signed)
PULMONARY  / CRITICAL CARE MEDICINE  Name: Linda Avila MRN: [id number] DOB: December 23, 1928    ADMISSION DATE:  12/07/2013 CONSULTATION DATE:  12/07/2013  REFERRING MD :  Dr. Tyson Alias PRIMARY SERVICE: PCCM  CHIEF COMPLAINT:  Abdominal Pain  BRIEF PATIENT DESCRIPTION:  79 year old Caucasian female with CAD, HTN, Chronic AFIB on Coumadin, Hypothyroidism, Anxiety, Overactive Baldder, Renal Cyst, IBS, Diverticulosis, CKD, and Osteoporosis being evaluated for abdominal pain and PCC is being asked to manage the patient for shock, abdominal wall hematoma, UTI, and Hypercoagulopathy.  SIGNIFICANT EVENTS / STUDIES:  12/09 UA >>> Dirty urine in setting of chronic, repeated UTI 12/09 CT Abdomen >>> Rectus sheet hematoma 12/09 PT/INR >>> INR at 4.01 and Hgb 10.2, baseline at 12.1 12/09 Lactic Acid Level at 6.06 12/09 4 units of FFP given and 5mg  of Vitamin K given IV 12/10 Much improved. Transfer to Tele bed. Cards consult for AF 12-11 co back pain. BP elevated  LINES / TUBES:   CULTURES: 12/06 MRSA PCR >>> Negative 12/06 Urine culture >>   ANTIBIOTICS: Rocephin 12/09 >>> 12/09 Ceftazidime 12/09 >>    SUBJECTIVE:  No distress, no complaints. Minimal abdominal pain  VITAL SIGNS: Temp:  [97.6 F (36.4 C)-98.7 F (37.1 C)] 98 F (36.7 C) (12/11 0442) Pulse Rate:  [95-113] 113 (12/11 0442) Resp:  [18-21] 20 (12/11 0442) BP: (144-192)/(58-92) 186/77 mmHg (12/11 0442) SpO2:  [97 %-100 %] 97 % (12/11 0442) Weight:  [161 lb 9.6 oz (73.301 kg)-167 lb (75.751 kg)] 161 lb 9.6 oz (73.301 kg) (12/11 0442) HEMODYNAMICS:   VENTILATOR SETTINGS:   INTAKE / OUTPUT: Intake/Output     12/10 0701 - 12/11 0700 12/11 0701 - 12/12 0700   P.O. 720 220   I.V. (mL/kg) 263 (3.6)    Blood     IV Piggyback 100    Total Intake(mL/kg) 1083 (14.8) 220 (3)   Urine (mL/kg/hr) 2350 (1.3) 250 (1.2)   Total Output 2350 250   Net -1267 -30        Stool Occurrence 1 x      PHYSICAL EXAMINATION: General:   WDWN, NAD, sitting in chair, looks goog Neuro:  Non focal HEENT:  WNL Cardiovascular: RRR s M Lungs:  Clear Abdomen:  Nondistended, minimally tender on R side, +BS Ext: warm, no edema  LABS:  Recent Labs Lab 12/07/13 0750 12/08/13 0209 12/09/13 0435  NA 141 140 141  K 4.7 3.7 3.3*  CL 104 104 107  CO2 20 27 28   BUN 35* 26* 20  CREATININE 1.48* 1.37* 1.08  GLUCOSE 279* 153* 121*    Recent Labs Lab 12/08/13 0209 12/08/13 0805 12/09/13 0435  HGB 6.5* 8.0* 8.4*  HCT 19.6* 24.0* 24.5*  WBC 15.5* 15.6* 14.2*  PLT 163 159 170    Lab Results  Component Value Date   INR 1.10 12/08/2013   INR 2.95* 12/07/2013   INR 4.01* 12/07/2013   Ct Abdomen Pelvis W Contrast  12/07/2013   CLINICAL DATA:  Abdominal pain.  On anticoagulants.  Possible fall.  EXAM: CT ABDOMEN AND PELVIS WITH CONTRAST  TECHNIQUE: Multidetector CT imaging of the abdomen and pelvis was performed using the standard protocol following bolus administration of intravenous contrast.  CONTRAST:  80mL OMNIPAQUE IOHEXOL 300 MG/ML  SOLN  COMPARISON:  None.  FINDINGS: There is a large right-sided rectus sheath hematoma. This measures 8.3 cm transversely x 4.7 cm AP x 11.4 cm longitudinally. There is extension of blood inferiorly along the fascial planes and  there is also some blood within the peritoneal cavity of the pelvis. There is no evidence of a subcapsular hemorrhage of the liver or spleen or kidneys. The kidneys demonstrate multiple hypodensities which may reflect cysts. One hypodensity in the lower pole of the right kidney measures 3.2 cm in greatest dimension and has Hounsfield measurment of +7. On the left a midpole cystic appearing structure measures 3.7 x 2.1 cm and exhibits Hounsfield measurement of +2. There is no hyperdense urine demonstrated. The perinephric fat is normal in density. The caliber of the abdominal aorta is normal. There are subcentimeter hypodensities in the liver which likely reflects cysts. There is  a small amount of intermediate density fluid surrounding the liver. It has Hounsfield measurement of +27. The gallbladder, pancreas, spleen, partially distended stomach, and adrenal glands are normal in appearance. The partially contrast filled loops of small bowel appear normal. The colon demonstrates a normal stool and gas pattern.  The lumbar vertebral bodies are preserved in height. There is sclerosis of the parasymphyseal portion of the right superior pubic ramus weeks with an adjacent line. This may be old but correlation with symptoms here is needed. I do not see pelvic fractures elsewhere. The psoas musculature appears normal. The lung bases are clear.  IMPRESSION: 1. There is an acute right-sided rectus sheath hematoma with dimensions as given above. There is a small amount fluid surrounding the liver, and within the pelvis high density fluid in the retroperitoneum and within the peritoneal cavity having extended from the inferior aspect of the rectus sheath. 2. There is no evidence of a subcapsular hemorrhage of the liver or spleen or kidneys. There are multiple hypodensities within the kidneys likely reflecting cysts but this could be confirmed with ultrasound when the patient has recovered from her acute injury. 3. There is no acute bowel abnormality. The abdominal aorta exhibits no evidence of an aneurysm or leakage of blood. 4. There is irregularity of the parasymphyseal portion of the right superior pubic ramus which appears reflect a fracture which is likely old given the surrounding sclerosis present. No acute pelvic fracture or lumbar spine fracture otherwise is demonstrated. 5. There is no evidence of a pleural effusion nor of a basilar pneumothorax. 6. These results were called by me by telephone at the time of interpretation on 12/07/2013 at 10:25 AM to Dr. Lorre Nick , who verbally acknowledged these results.   Electronically Signed   By: David  [place]   On: 12/07/2013 10:27   Dg Abd Acute  W/chest  12/07/2013   CLINICAL DATA:  Abdominal pain  EXAM: ACUTE ABDOMEN SERIES (ABDOMEN 2 VIEW & CHEST 1 VIEW)  COMPARISON:  10/09/2013  FINDINGS: Cardiac shadow is stable. The lungs are clear bilaterally. The abdomen shows a nonobstructive bowel gas pattern. Contrast material from a day upcoming CT is noted. Fecal material is noted throughout the colon. No free air is seen. No abnormal mass is noted.  IMPRESSION: Nonspecific chest and abdomen.   Electronically Signed   By: Alcide Clever M.D.   On: 12/07/2013 10:43     CXR: NNF   ASSESSMENT / PLAN:   CARDIOVASCULAR A:  Chronic Paroxysmal AF NSR this AM H/O hypertension  H/O CAD P:  Resume anti-hypertensives 12-11 The question is when or if to resume anticoagulation. In 79 yo with PAF do beneifits outweigh risks, especially with a confused patient.  Xarelto may be a consideration but same risks  We will restart bp meds, observe one more day in house -  d/w Dr Anne Fu - No anticoag on discharge & consider novel agent in 1-2 wks  RENAL Lab Results  Component Value Date   CREATININE 1.08 12/09/2013   CREATININE 1.37* 12/08/2013   CREATININE 1.48* 12/07/2013    Recent Labs Lab 12/07/13 0750 12/08/13 0209 12/09/13 0435  K 4.7 3.7 3.3*      A:   AKI. Resolved CKD stage I/II Lactic Acidosis, resolved P:   Monitor BMET intermittently Correct electrolytes as indicated DC IVFs 12/10  GASTROINTESTINAL A:   Rectus Sheath Hematoma  P:     HEMATOLOGIC Lab Results  Component Value Date   INR 1.10 12/08/2013   INR 2.95* 12/07/2013   INR 4.01* 12/07/2013    A:  Acute Blood Loss Anemia Warfarin induced coagulopathy - reversed No evidence of ongoing active bleeding P:  Monitor CBC intermittently Monitor coags intermittently  INFECTIOUS A:   Leukocytosis Urinary Tract Infection Sepsis, resolved P:   Micro and abx as above  ENDOCRINE/RHEUMATOLOGY A:  Hypothyroidism  Polymyalgia Rheumatica - 5mg  Prednisone BID   Chronic steroids - presuemd adrenal insuff Elevated Blood Glucose, minimal   P:   Cont synthroid DC HC. Resume home dose of prednisone    Brett Canales Minor ACNP Adolph Pollack PCCM Pager [phone] till 3 pm If no answer page [phone]  Independently examined pt, evaluated data & formulated above care plan with NP who scribed this note & edited by me.  ALVA,RAKESH V.  12/09/2013, 9:56 AM

## 2013-12-09 NOTE — Progress Notes (Addendum)
Subjective:  Linda Avila feels better. No SOB, no CP.  When getting up for bathroom at night, heart rate can increase to the 140's transiently.   Currently NSR. PAF previously noted.   Son in room.   Objective:  Vital Signs in the last 24 hours: Temp:  [97.6 F (36.4 C)-98.7 F (37.1 C)] 98 F (36.7 C) (12/11 0442) Pulse Rate:  [95-113] 113 (12/11 0442) Resp:  [18-20] 20 (12/11 0442) BP: (144-192)/(59-92) 186/77 mmHg (12/11 0442) SpO2:  [97 %-100 %] 97 % (12/11 0442) Weight:  [161 lb 9.6 oz (73.301 kg)-167 lb (75.751 kg)] 161 lb 9.6 oz (73.301 kg) (12/11 0442)  Intake/Output from previous day: 12/10 0701 - 12/11 0700 In: 1083 [P.O.:720; I.V.:263; IV Piggyback:100] Out: 2350 [Urine:2350]   Physical Exam: General: Well developed, well nourished, in no acute distress. Head:  Normocephalic and atraumatic. Lungs: Clear to auscultation and percussion. Heart: Normal S1 and S2.  No murmur, rubs or gallops.  Abdomen: soft, non-tender, positive bowel sounds. Mildly tender RLQ, hematoma.  Extremities: No clubbing or cyanosis. No edema. Ecchymosis.  Neurologic: Alert and oriented x 3.    Lab Results:  Recent Labs  12/08/13 0805 12/09/13 0435  WBC 15.6* 14.2*  HGB 8.0* 8.4*  PLT 159 170    Recent Labs  12/08/13 0209 12/09/13 0435  NA 140 141  K 3.7 3.3*  CL 104 107  CO2 27 28  GLUCOSE 153* 121*  BUN 26* 20  CREATININE 1.37* 1.08   No results found for this basename: TROPONINI, CK, MB,  in the last 72 hours Hepatic Function Panel  Recent Labs  12/07/13 0750  PROT 5.2*  ALBUMIN 2.7*  AST 17  ALT 14  ALKPHOS 77  BILITOT 0.5     Imaging: Ct Abdomen Pelvis W Contrast  12/07/2013   CLINICAL DATA:  Abdominal pain.  On anticoagulants.  Possible fall.  EXAM: CT ABDOMEN AND PELVIS WITH CONTRAST  TECHNIQUE: Multidetector CT imaging of the abdomen and pelvis was performed using the standard protocol following bolus administration of intravenous contrast.  CONTRAST:   80mL OMNIPAQUE IOHEXOL 300 MG/ML  SOLN  COMPARISON:  None.  FINDINGS: There is a large right-sided rectus sheath hematoma. This measures 8.3 cm transversely x 4.7 cm AP x 11.4 cm longitudinally. There is extension of blood inferiorly along the fascial planes and there is also some blood within the peritoneal cavity of the pelvis. There is no evidence of a subcapsular hemorrhage of the liver or spleen or kidneys. The kidneys demonstrate multiple hypodensities which may reflect cysts. One hypodensity in the lower pole of the right kidney measures 3.2 cm in greatest dimension and has Hounsfield measurment of +7. On the left a midpole cystic appearing structure measures 3.7 x 2.1 cm and exhibits Hounsfield measurement of +2. There is no hyperdense urine demonstrated. The perinephric fat is normal in density. The caliber of the abdominal aorta is normal. There are subcentimeter hypodensities in the liver which likely reflects cysts. There is a small amount of intermediate density fluid surrounding the liver. It has Hounsfield measurement of +27. The gallbladder, pancreas, spleen, partially distended stomach, and adrenal glands are normal in appearance. The partially contrast filled loops of small bowel appear normal. The colon demonstrates a normal stool and gas pattern.  The lumbar vertebral bodies are preserved in height. There is sclerosis of the parasymphyseal portion of the right superior pubic ramus weeks with an adjacent line. This may be old but correlation with symptoms here  is needed. I do not see pelvic fractures elsewhere. The psoas musculature appears normal. The lung bases are clear.  IMPRESSION: 1. There is an acute right-sided rectus sheath hematoma with dimensions as given above. There is a small amount fluid surrounding the liver, and within the pelvis high density fluid in the retroperitoneum and within the peritoneal cavity having extended from the inferior aspect of the rectus sheath. 2. There is no  evidence of a subcapsular hemorrhage of the liver or spleen or kidneys. There are multiple hypodensities within the kidneys likely reflecting cysts but this could be confirmed with ultrasound when the patient has recovered from Linda Avila acute injury. 3. There is no acute bowel abnormality. The abdominal aorta exhibits no evidence of an aneurysm or leakage of blood. 4. There is irregularity of the parasymphyseal portion of the right superior pubic ramus which appears reflect a fracture which is likely old given the surrounding sclerosis present. No acute pelvic fracture or lumbar spine fracture otherwise is demonstrated. 5. There is no evidence of a pleural effusion nor of a basilar pneumothorax. 6. These results were called by me by telephone at the time of interpretation on 12/07/2013 at 10:25 AM to Dr. Lorre Nick , who verbally acknowledged these results.   Electronically Signed   By: David  [place]   On: 12/07/2013 10:27   Personally viewed.   Telemetry: NSR now. Prior AFIB. See above.  Personally viewed.   BMET    Component Value Date/Time   NA 141 12/09/2013 0435   K 3.3* 12/09/2013 0435   CL 107 12/09/2013 0435   CO2 28 12/09/2013 0435   GLUCOSE 121* 12/09/2013 0435   BUN 20 12/09/2013 0435   CREATININE 1.08 12/09/2013 0435   CALCIUM 8.6 12/09/2013 0435   GFRNONAA 46* 12/09/2013 0435   GFRAA 53* 12/09/2013 0435     Assessment/Plan:  Active Problems:   Hematoma of abdominal wall   Anemia   1) PAF  - no changes made in medication for rate control. Occasional spikes in HR with activity. Brief.   2) Chronic anticoagulation - second episode of anemia with coumadin. Will not use coumadin again. INR 4 on admit. Not that elevated. Rectus sheath bleeds can be spontaneous.  I would be fine with discharge tomorrow with NO anticoagulation.  I would like Linda Avila to hold anticoagulation for now over the next week until clinic visit where we can either continue to hold (with increased risk of stroke)  or start NOAC such as Xarelto or Eliquis. I will discuss with [facility] Pharm D as well. Linda Avila risk of future bleed remains. Linda Avila 12/09/2013, 10:21 AM

## 2013-12-09 NOTE — Progress Notes (Signed)
Pt a/o, c/o back pain but pt wanted hot packs which alivitated her pain, pt ambulated 2x in the hallway and tolerated well, pt HR goes into the 140's when up ambulating, cardiology aware, pt stable, will continue to monitor

## 2013-12-10 DIAGNOSIS — D649 Anemia, unspecified: Secondary | ICD-10-CM

## 2013-12-10 LAB — CBC
HCT: 28.1 % — ABNORMAL LOW (ref 36.0–46.0)
Hemoglobin: 9.6 g/dL — ABNORMAL LOW (ref 12.0–15.0)
MCH: 31.3 pg (ref 26.0–34.0)
MCHC: 34.2 g/dL (ref 30.0–36.0)
WBC: 11.1 10*3/uL — ABNORMAL HIGH (ref 4.0–10.5)

## 2013-12-10 LAB — BASIC METABOLIC PANEL
BUN: 16 mg/dL (ref 6–23)
CO2: 26 mEq/L (ref 19–32)
Chloride: 104 mEq/L (ref 96–112)
GFR calc Af Amer: 62 mL/min — ABNORMAL LOW (ref >90)
Potassium: 3.6 mEq/L (ref 3.5–5.1)

## 2013-12-10 MED ORDER — ALPRAZOLAM 0.25 MG PO TABS
0.2500 mg | ORAL_TABLET | Freq: Once | ORAL | Status: AC
  Administered 2013-12-10: 0.25 mg via ORAL

## 2013-12-10 MED ORDER — DILTIAZEM HCL ER COATED BEADS 240 MG PO CP24: 240.0000 mg | ORAL_CAPSULE | Freq: Every day | ORAL | Status: AC

## 2013-12-10 MED ORDER — DILTIAZEM HCL ER COATED BEADS 240 MG PO CP24
240.0000 mg | ORAL_CAPSULE | Freq: Every day | ORAL | Status: DC
  Administered 2013-12-10: 240 mg via ORAL
  Filled 2013-12-10 (×2): qty 1

## 2013-12-10 NOTE — Discharge Summary (Signed)
Physician Discharge Summary  Patient ID: Linda Avila MRN: [id number] DOB/AGE: 1928/06/11 79 y.o.  Admit date: 12/07/2013 Discharge date: 12/10/2013  Problem List Active Problems:   Hematoma of abdominal wall   Anemia  HPI: 79 year old Caucasian female with CAD, HTN, Chronic AFIB on Coumadin, Hypothyroidism, Anxiety, Overactive Baldder, Renal Cyst, IBS, Diverticulosis, CKD, and Osteoporosis being evaluated for abdominal pain and PCC is being asked to manage the patient for shock, abdominal wall hematoma, UTI, and Hypercoagulopathy. According to family, patient, and EMR notes, the patient was in [place] on vacation for 1 month and was put on Keflex for a UTI and her INR and Coumadin was not adjusted like usual. She was found by her husband in the bathroom this morning. He stated that she did not loose conscious no pass out. She was down in response to her abdominal pain that has been going on and was worsened this morning. She described the pain as cramping and radiates to her left lower quadrant. She went down to her knee due to this pain as well as feeling weak this morning.  In the ED, initial exam was positive for abdominal pain as well as on ROS. Blood work reveal INR at 4.01, acute anemia with hgb at 10.2 from 12.1 at baseline, Lactic Acid was 6.06, and has rectus sheet hematoma on CT scan of the abdomen when evaluated for her abdominal pain. She was seen and examined in ED with family at bedside. She has no specific complaints and her abdominal pain has improved since she had been to the ED. 4 units of FFP, 5mg  of Vitamin K was given in the ED by PCCM. She will be admitted to the ICU for further management.     Hospital Course: SIGNIFICANT EVENTS / STUDIES:  12/09 UA >>> Dirty urine in setting of chronic, repeated UTI  12/09 CT Abdomen >>> Rectus sheet hematoma  12/09 PT/INR >>> INR at 4.01 and Hgb 10.2, baseline at 12.1  12/09 Lactic Acid Level at 6.06  12/09 4 units of FFP given and 5mg   of Vitamin K given IV  12/10 Much improved. Transfer to Tele bed. Cards consult for AF  12-11 co back pain. BP elevated  LINES / TUBES:  CULTURES:  12/06 MRSA PCR >>> Negative  12/06 Urine culture >> non diagnostic  ANTIBIOTICS:  Rocephin 12/09 >>> 12/09  Ceftazidime 12/09 >>12-12   ASSESSMENT / PLAN:  CARDIOVASCULAR  A:  Chronic Paroxysmal AF  NSR this AM  H/O hypertension  H/O CAD  P:  Resume anti-hypertensives 12-11 , increased diltiazem 12-12 on dc.  The question is when or if to resume anticoagulation.  In 79 yo with PAF do beneifits outweigh risks, especially with a confused patient. Xarelto may be a consideration but same risks  We will restart bp meds, observe one more day in house - d/w Dr Anne Fu - No anticoag on discharge & consider novel agent in 1-2 wks  RENAL    Recent Labs   Recent Labs Lab 12/08/13 0209 12/09/13 0435 12/10/13 0640  K 3.7 3.3* 3.6    Lab Results  Component Value Date   CREATININE 0.96 12/10/2013   CREATININE 1.08 12/09/2013   CREATININE 1.37* 12/08/2013    A:  AKI. Resolved  CKD stage I/II  Lactic Acidosis, resolved  P:  Monitor BMET intermittently  Correct electrolytes as indicated  DC IVFs 12/10  GASTROINTESTINAL  A:  Rectus Sheath Hematoma  P:  HEMATOLOGIC  Lab Results  Component  Value Date   INR 1.10 12/08/2013   INR 2.95* 12/07/2013   INR 4.01* 12/07/2013    Recent Labs  12/09/13 0435 12/10/13 0640  HGB 8.4* 9.6*    A: Acute Blood Loss Anemia  Warfarin induced coagulopathy - reversed  No evidence of ongoing active bleeding  P:  Monitor CBC intermittently  Monitor coags intermittently  INFECTIOUS  A:  Leukocytosis  Urinary Tract Infection  Sepsis, resolved  P:  Micro and abx as above  DC abx on discharge ENDOCRINE/RHEUMATOLOGY  A:  Hypothyroidism  Polymyalgia Rheumatica - 5mg  Prednisone BID  Chronic steroids - presuemd adrenal insuff  Elevated Blood Glucose, minimal  P:  Cont synthroid  DC HC.  Resume home dose of prednisone         Labs at discharge Lab Results  Component Value Date   CREATININE 0.96 12/10/2013   BUN 16 12/10/2013   NA 141 12/10/2013   K 3.6 12/10/2013   CL 104 12/10/2013   CO2 26 12/10/2013   Lab Results  Component Value Date   WBC 11.1* 12/10/2013   HGB 9.6* 12/10/2013   HCT 28.1* 12/10/2013   MCV 91.5 12/10/2013   PLT 204 12/10/2013   Lab Results  Component Value Date   ALT 14 12/07/2013   AST 17 12/07/2013   ALKPHOS 77 12/07/2013   BILITOT 0.5 12/07/2013   Lab Results  Component Value Date   INR 1.10 12/08/2013   INR 2.95* 12/07/2013   INR 4.01* 12/07/2013    Current radiology studies No results found.  Disposition:  01-Home or Self Care  Discharge Orders   Future Appointments Provider Department Dept Phone   12/14/2013 3:00 PM [facility] [phone]   Please arrive 15 minutes prior to your appointment time.   12/16/2013 9:45 AM Donato Schultz, MD [facility] [phone]   Future Orders Complete By Expires   Discharge patient  As directed        Medication List    STOP taking these medications       diltiazem 120 MG 24 hr capsule  Commonly known as:  DILACOR XR     warfarin 1 MG tablet  Commonly known as:  COUMADIN      TAKE these medications       ALPRAZolam 0.25 MG tablet  Commonly known as:  XANAX  Take 0.25 mg by mouth at bedtime as needed. For anxiety     diltiazem 240 MG 24 hr capsule  Commonly known as:  CARDIZEM CD  Take 1 capsule (240 mg total) by mouth daily.     DULoxetine 30 MG capsule  Commonly known as:  CYMBALTA  Take 30 mg by mouth 2 (two) times daily.     fentaNYL 50 MCG/HR  Commonly known as:  DURAGESIC - dosed mcg/hr  Place 1 patch onto the skin every 3 (three) days.     furosemide 20 MG tablet  Commonly known as:  LASIX  Take 40 mg by mouth daily.     levothyroxine 88 MCG tablet  Commonly known as:  SYNTHROID, LEVOTHROID  Take 88 mcg  by mouth daily.     losartan 50 MG tablet  Commonly known as:  COZAAR  Take 50 mg by mouth daily.     oxybutynin 3.9 MG/24HR  Commonly known as:  OXYTROL  Place 1 patch onto the skin every 3 (three) days. Pt removed patch yesterday 12/06/13     oxyCODONE-acetaminophen 5-325 MG per tablet  Commonly known as:  PERCOCET/ROXICET  Take 1 tablet by mouth daily as needed for severe pain. For pain     Potassium Chloride CR 8 MEQ Cpcr capsule CR  Commonly known as:  MICRO-K  Take 8 mEq by mouth daily.     predniSONE 5 MG tablet  Commonly known as:  DELTASONE  Take 5 mg by mouth 2 (two) times daily.           Follow-up Information   Follow up with Ginette Otto, MD On 12/14/2013. (11:30 am)    Specialty:  Internal Medicine   Contact information:   [address] [place] [address] [phone]       Follow up with Donato Schultz, MD. (Office will make an appointment for 1 week. Please call office if you have not heard from offfice within 48 hours after discharge.)    Specialty:  Cardiology   Contact information:   [address] [place] [address] [phone]        Discharged Condition: fair  Time spent on discharge greater than 40 minutes.  Vital signs at Discharge. Temp:  [97.4 F (36.3 C)-98 F (36.7 C)] 97.4 F (36.3 C) (12/12 0515) Pulse Rate:  [74-115] 115 (12/12 0515) Resp:  [18-20] 18 (12/12 0515) BP: (102-170)/(50-91) 151/90 mmHg (12/12 0515) SpO2:  [95 %-98 %] 98 % (12/12 0515) Weight:  [159 lb 3.2 oz (72.213 kg)] 159 lb 3.2 oz (72.213 kg) (12/12 0515) Office follow up Special Information or instructions.  No need to follow up with critical care. She has appointments with Dr. Pete Glatter and Dr. Anne Fu. Please note chronic UTI's. Off abx on discharge. No anticoagulants at time of discharge -cardiology to reassess need on FU in 1 week.   Signed: Brett Canales Minor ACNP [facility]ack PCCM Pager [phone] till 3 pm If no  answer page [phone]  Independently examined pt, evaluated data & formulated above discharge care plan with NP who scribed this note & edited by me.  ALVA,RAKESH V.   12/10/2013, 9:23 AM

## 2013-12-10 NOTE — Progress Notes (Signed)
Pt a/o, c/o back pain, PRN med given as ordered, pt being dc to home with husband. D/c instruction given to pt, medications and follow up appointments reviewed with pt and husband, they both verbalized understanding, pt left via wheelchair with husband, pt stable

## 2013-12-10 NOTE — Progress Notes (Addendum)
    Subjective:  Mild back pain which she thinks is because of uncomfortable bed.  No CP, no SOB, no palps.   Heart rate can increase (sinus tach) to 140 with ambulation.   Objective:  Vital Signs in the last 24 hours: Temp:  [97.4 F (36.3 C)-98 F (36.7 C)] 97.4 F (36.3 C) (12/12 0515) Pulse Rate:  [74-115] 115 (12/12 0515) Resp:  [18-20] 18 (12/12 0515) BP: (102-170)/(50-91) 151/90 mmHg (12/12 0515) SpO2:  [95 %-98 %] 98 % (12/12 0515) Weight:  [159 lb 3.2 oz (72.213 kg)] 159 lb 3.2 oz (72.213 kg) (12/12 0515)  Intake/Output from previous day: 12/11 0701 - 12/12 0700 In: 1000 [P.O.:1000] Out: 2250 [Urine:2250]   Physical Exam: General: Elderly, Well developed, well nourished, in no acute distress. Head:  Normocephalic and atraumatic. Lungs: Clear to auscultation and percussion. Heart: Normal S1 and S2.  No murmur, rubs or gallops.  Abdomen: soft, non-tender, positive bowel sounds. Mildly RLQ tenderness, hematoma.  Extremities: No clubbing or cyanosis. No edema. Ecchymosis extremities.  Neurologic: Alert and oriented x 3.    Lab Results:  Recent Labs  12/09/13 0435 12/10/13 0640  WBC 14.2* 11.1*  HGB 8.4* 9.6*  PLT 170 204    Recent Labs  12/09/13 0435 12/10/13 0640  NA 141 141  K 3.3* 3.6  CL 107 104  CO2 28 26  GLUCOSE 121* 122*  BUN 20 16  CREATININE 1.08 0.96    Telemetry: NSR, PAC, occasional bursts of sinus tachy. No AFIb.  Personally viewed.   Assessment/Plan:  Active Problems:   Hematoma of abdominal wall   Anemia  1) PAF - currently NSR.  2) Sinus tachycardia - transient. I will increase diltiazem from 120 to 240 CD QD.  3) Chronic anticoagulation - currently on hold. No coumadin from this point out. Will contemplate low dose Eliquis 2.5 BID in the future (discuss in follow up in one week) but may consider witholding anticoagulation alltogether given her spontaneous rectus sheath bleed with minimally supratheraputic INR 4. Have talked as  well with [employer] [facility]. For now NO anticoagulation. She understands stroke risks without anticoagulation.   OK from my standpoint for DC. Thanks.   4) Hematoma - Hg stable.    Lorriann Hansmann 12/10/2013, 8:27 AM

## 2013-12-13 LAB — CULTURE, BLOOD (ROUTINE X 2)
Culture: NO GROWTH
Culture: NO GROWTH

## 2013-12-14 ENCOUNTER — Ambulatory Visit (HOSPITAL_COMMUNITY)
Admission: RE | Admit: 2013-12-14 | Discharge: 2013-12-14 | Disposition: A | Discharge status: Home / Self Care | Payer: Medicare Other | Source: Ambulatory Visit | Attending: Urology | Admitting: Urology

## 2013-12-16 ENCOUNTER — Ambulatory Visit (INDEPENDENT_AMBULATORY_CARE_PROVIDER_SITE_OTHER): Payer: Medicare Other | Admitting: Cardiology

## 2013-12-16 ENCOUNTER — Encounter: Payer: Self-pay | Admitting: Cardiology

## 2013-12-16 VITALS — BP 118/80 | HR 60 | Wt 163.0 lb

## 2013-12-16 DIAGNOSIS — I4891 Unspecified atrial fibrillation: Secondary | ICD-10-CM

## 2013-12-16 DIAGNOSIS — Z5189 Encounter for other specified aftercare: Secondary | ICD-10-CM

## 2013-12-16 DIAGNOSIS — I480 Paroxysmal atrial fibrillation: Secondary | ICD-10-CM

## 2013-12-16 DIAGNOSIS — I10 Essential (primary) hypertension: Secondary | ICD-10-CM

## 2013-12-16 DIAGNOSIS — T50901S Poisoning by unspecified drugs, medicaments and biological substances, accidental (unintentional), sequela: Secondary | ICD-10-CM

## 2013-12-16 DIAGNOSIS — T6591XS Toxic effect of unspecified substance, accidental (unintentional), sequela: Secondary | ICD-10-CM

## 2013-12-16 DIAGNOSIS — S301XXD Contusion of abdominal wall, subsequent encounter: Secondary | ICD-10-CM

## 2013-12-16 LAB — CBC WITH DIFFERENTIAL/PLATELET
Basophils Relative: 0.3 % (ref 0.0–3.0)
Eosinophils Absolute: 0.1 10*3/uL (ref 0.0–0.7)
Hemoglobin: 10.1 g/dL — ABNORMAL LOW (ref 12.0–15.0)
Lymphocytes Relative: 8.6 % — ABNORMAL LOW (ref 12.0–46.0)
MCHC: 33.2 g/dL (ref 30.0–36.0)
MCV: 93.1 fl (ref 78.0–100.0)
Monocytes Absolute: 0.9 10*3/uL (ref 0.1–1.0)
Neutro Abs: 10.3 10*3/uL — ABNORMAL HIGH (ref 1.4–7.7)
Neutrophils Relative %: 83.2 % — ABNORMAL HIGH (ref 43.0–77.0)
Platelets: 402 10*3/uL — ABNORMAL HIGH (ref 150.0–400.0)
RBC: 3.27 Mil/uL — ABNORMAL LOW (ref 3.87–5.11)
RDW: 19.3 % — ABNORMAL HIGH (ref 11.5–14.6)

## 2013-12-16 NOTE — Patient Instructions (Signed)
Your physician recommends that have for lab work today: CBC  Your physician recommends that you schedule a follow-up appointment in: 3 weeks with Alfonse Ras  Your physician recommends that you schedule a follow-up appointment in: 6 weeks with DR.Skains  Your physician recommends that you continue on your current medications as directed. Please refer to the Current Medication list given to you today.

## 2013-12-16 NOTE — Progress Notes (Signed)
[address] [place], [place]  [address] Phone: [phone] Fax:  [phone]  Date:  12/16/2013   ID:  Linda Avila, DOB 12-Oct-1928, MRN [id number]  PCP:  Ginette Otto, MD   History of Present Illness: Linda Avila is a 79 y.o. female with persistent atrial fibrillation, who has had 2 separate episodes of bleeding while on Coumadin. The first episode occurred after she was taking double the dose she was prescribed. The second occurred after she was in [place] and received antibiotic for urinary tract infection and had a supratherapeutic INR of 4 and developed a rectus abdominal muscle spontaneous bleed. This was in early December 2014. Critical care medicine followed her and we decided to hold her anticoagulation at that time and to be discussed.  Hemoglobin was 6.5 down from 11.5 at baseline.    Wt Readings from Last 3 Encounters:  12/16/13 163 lb (73.936 kg)  12/10/13 159 lb 3.2 oz (72.213 kg)  10/19/13 166 lb (75.297 kg)     Past Medical History  Diagnosis Date  . Coronary artery disease   . Hypertension   . Atrial fibrillation   . Hypothyroidism   . Anxiety   . Hypothyroidism   . Anxiety   . Overactive bladder   . Renal cyst   . IBS (irritable bowel syndrome)   . Diverticulosis   . Osteoarthritis   . Renal disease   . Adenomatous polyp   . Osteoporosis     Past Surgical History  Procedure Laterality Date  . Back surgery    . Rotator cuff repair      Right  . Knee surgery      Right  . Abdominal hysterectomy    . Appendectomy    . Cataract extraction      Bilateral    Current Outpatient Prescriptions  Medication Sig Dispense Refill  . ALPRAZolam (XANAX) 0.25 MG tablet Take 0.25 mg by mouth at bedtime as needed. For anxiety      . diltiazem (CARDIZEM CD) 240 MG 24 hr capsule Take 1 capsule (240 mg total) by mouth daily.  30 capsule  1  . DULoxetine (CYMBALTA) 30 MG capsule Take 30 mg by mouth 2 (two) times daily.      .  fentaNYL (DURAGESIC - DOSED MCG/HR) 50 MCG/HR Place 1 patch onto the skin every 3 (three) days.      Marland Kitchen levothyroxine (SYNTHROID, LEVOTHROID) 88 MCG tablet Take 88 mcg by mouth daily.      Marland Kitchen losartan (COZAAR) 50 MG tablet Take 50 mg by mouth daily.      Marland Kitchen oxybutynin (OXYTROL) 3.9 MG/24HR Place 1 patch onto the skin every 3 (three) days. Pt removed patch yesterday 12/06/13      . oxyCODONE-acetaminophen (PERCOCET/ROXICET) 5-325 MG per tablet Take 1 tablet by mouth daily as needed for severe pain. For pain      . predniSONE (DELTASONE) 5 MG tablet Take 5 mg by mouth 2 (two) times daily.       No current facility-administered medications for this visit.    Allergies:    Allergies  Allergen Reactions  . Sulfa Antibiotics Other (See Comments)    unknown    Social History:  The patient  reports that she has never smoked. She does not have any smokeless tobacco history on file. She reports that she does not drink alcohol or use illicit drugs.   ROS:  Please see the history of present  illness.   Minor abdominal discomfort, no syncope, no bleeding. Minor weakness. Mild shortness of breath.    PHYSICAL EXAM: VS:  BP 118/80  Pulse 60  Wt 163 lb (73.936 kg) Well nourished, well developed, in no acute distress HEENT: normal Neck: no JVD Cardiac:  normal; RRR; no murmur Lungs:  clear to auscultation bilaterally, no wheezing, rhonchi or rales Abd: soft, nontender, no hepatomegalyecchymosis noted along right flank, umbilicus Ext: no edema Skin: warm and dry Neuro: no focal abnormalities noted  EKG: sinus rhythm Echocardiogram 10/11/13: Left ventricle: The cavity size was normal. Wall thickness was increased in a pattern of moderate LVH. Systolic function was normal. The estimated ejection fraction was in the range of 60% to 65%. Doppler parameters are consistent with abnormal left ventricular relaxation (grade 1 diastolic dysfunction).     ASSESSMENT AND PLAN:  1. Atrial fibrillation -  we will see her back in 3 weeks with [facility], Pharm.D. And if at that time her ecchymosis and abdominal discomfort from hematoma have improved, we will start her on low-dose Eliquis 2.5 mg twice a day. We had lengthy discussion today in clinic with her and her daughter and they understand the bleeding risk as well as the stroke risk without medication. We mutually decided to proceed with this plan. We will check a CBC today. Riki Rusk Smart will follow her closely in the anticoagulation clinic, hemoglobin, creatinine for instance. 2. Abdominal wall bleed-recent hospitalization, one unit packed red blood cells. As above. Stable. Improving. Still with minor discomfort abdominal wall. Ecchymosis noted. 3. Chronic anticoagulation-as above. 4. Sinus tachycardia-diltiazem as above.  Signed, Donato Schultz, MD [facility]  12/16/2013 10:21 AM

## 2013-12-17 ENCOUNTER — Telehealth: Payer: Self-pay | Admitting: Cardiology

## 2013-12-17 NOTE — Telephone Encounter (Signed)
Follow Up  Pt called for results//SR

## 2013-12-20 NOTE — Telephone Encounter (Signed)
Patient would like results of blood work, please call & advise.

## 2013-12-20 NOTE — Telephone Encounter (Signed)
Spoke with patient - advised of results

## 2013-12-29 ENCOUNTER — Ambulatory Visit (HOSPITAL_COMMUNITY)
Admission: RE | Admit: 2013-12-29 | Discharge: 2013-12-29 | Disposition: A | Discharge status: Home / Self Care | Payer: Medicare Other | Source: Ambulatory Visit | Attending: Urology | Admitting: Urology

## 2013-12-29 DIAGNOSIS — N289 Disorder of kidney and ureter, unspecified: Secondary | ICD-10-CM | POA: Insufficient documentation

## 2013-12-29 DIAGNOSIS — N281 Cyst of kidney, acquired: Secondary | ICD-10-CM | POA: Insufficient documentation

## 2013-12-29 MED ORDER — GADOBENATE DIMEGLUMINE 529 MG/ML IV SOLN
14.0000 mL | Freq: Once | INTRAVENOUS | Status: AC | PRN
  Administered 2013-12-29: 14 mL via INTRAVENOUS

## 2014-01-06 ENCOUNTER — Telehealth: Payer: Self-pay | Admitting: *Deleted

## 2014-01-06 ENCOUNTER — Other Ambulatory Visit: Payer: Medicare Other

## 2014-01-06 ENCOUNTER — Ambulatory Visit (INDEPENDENT_AMBULATORY_CARE_PROVIDER_SITE_OTHER): Payer: Medicare Other | Admitting: Pharmacist

## 2014-01-06 DIAGNOSIS — I4891 Unspecified atrial fibrillation: Secondary | ICD-10-CM

## 2014-01-06 DIAGNOSIS — Z79899 Other long term (current) drug therapy: Secondary | ICD-10-CM

## 2014-01-06 LAB — BASIC METABOLIC PANEL
BUN: 22 mg/dL (ref 6–23)
CO2: 31 meq/L (ref 19–32)
Calcium: 9.5 mg/dL (ref 8.4–10.5)
Chloride: 102 mEq/L (ref 96–112)
Creatinine, Ser: 1.3 mg/dL — ABNORMAL HIGH (ref 0.4–1.2)
GFR: 40.07 mL/min — ABNORMAL LOW (ref >60.00)
GLUCOSE: 84 mg/dL (ref 70–99)
POTASSIUM: 4 meq/L (ref 3.5–5.1)
Sodium: 142 mEq/L (ref 135–145)

## 2014-01-06 LAB — CBC
HEMATOCRIT: 41.4 % (ref 36.0–46.0)
HEMOGLOBIN: 13.7 g/dL (ref 12.0–15.0)
MCHC: 33 g/dL (ref 30.0–36.0)
MCV: 97.9 fl (ref 78.0–100.0)
PLATELETS: 296 10*3/uL (ref 150.0–400.0)
RBC: 4.23 Mil/uL (ref 3.87–5.11)
RDW: 20.5 % — ABNORMAL HIGH (ref 11.5–14.6)
WBC: 16.5 10*3/uL — ABNORMAL HIGH (ref 4.5–10.5)

## 2014-01-06 MED ORDER — APIXABAN 2.5 MG PO TABS: 2.5000 mg | ORAL_TABLET | Freq: Two times a day (BID) | ORAL | Status: DC

## 2014-01-06 NOTE — Telephone Encounter (Signed)
PA faxed to [facility] for eliquis

## 2014-01-06 NOTE — Patient Instructions (Addendum)
A full discussion of the nature of anticoagulants has been carried out.  A benefit/risk analysis has been presented to the patient, so that they understand the justification for choosing anticoagulation with Eliquis at this time.  The need for compliance is stressed.  Pt is aware to take the medication twice daily.  Side effects of potential bleeding are discussed, including unusual colored urine or stools, coughing up blood or coffee ground emesis, nose bleeds or serious fall or head trauma.  Discussed signs and symptoms of stroke. The patient should avoid any OTC items containing aspirin or ibuprofen.  Avoid alcohol consumption.   Call if any signs of abnormal bleeding.  Discussed financial obligations and resolved any difficulty in obtaining medication.  Follow up in clinic in 1 month for visit to discuss cost, side effects, bleeding, etc.  Ysidro Evert will call you later today with lab work results and let you know if you can start Eliquis 2.5 mg twice daily.    Follow up with Ysidro Evert in 4 weeks at 02/03/14 at 11:15.

## 2014-01-06 NOTE — Progress Notes (Signed)
Pt will start on Eliquis 2.5 mg bid for Afib on 01/06/2014.  Patient has had 2 major bleeds while on coumadin.  She is also a fall risk, but given high CHADS-VASC score she needs to be on anticoagulation if possible.  Her most recently bleed occurred after she was in [place] last month and received antibiotic for urinary tract infection and had a supratherapeutic INR of 4 and developed a rectus abdominal muscle spontaneous bleed. This was in early December 2014.  She's had a MRI of abdomen this 10 days ago, and only major findings were cysts, which physician told patient were to be expected given her age.  Her bruising has almost resolved, and she denies any bleeding complications over past few weeks.  Her hemoglobin was trending up in mid-December, and will get another CBC today.  If hemoglobin is okay, will start low dose Eliquis 2.5 mg bid - patient understands to not start Eliquis until she hears from me.  She tells me her daughter has already called the insurance company, and Eliquis is covered under her plan.  Reviewed patients medication list.  Pt is currently on any combined P-gp and strong CYP3A4 inhibitors/inducers (ketoconazole, traconazole, ritonavir, carbamazepine, phenytoin, rifampin, St. John's wort).  Reviewed labs.  SCr 0.96, Weight 68 kg, CrCl- 46 ml/min.  Dose appropriate based on age and fall risk per Dr. Marlou Porch we are going to use the lower dosage.   Hgb and HCT improving since her bleed last month - hemoglobin up to 10.1 on 12/16/13 and rechecking CBC and BMET today.  Will start Eliquis later tonight as long as hemoglobin has continued to trend up / leveled out.  A full discussion of the nature of anticoagulants has been carried out.  A benefit/risk analysis has been presented to the patient, so that they understand the justification for choosing anticoagulation with Eliquis at this time.  The need for compliance is stressed.  Pt is aware to take the medication twice daily.  Side effects  of potential bleeding are discussed, including unusual colored urine or stools, coughing up blood or coffee ground emesis, nose bleeds or serious fall or head trauma.  Discussed signs and symptoms of stroke. The patient should avoid any OTC items containing aspirin or ibuprofen.  Avoid alcohol consumption.   Call if any signs of abnormal bleeding.  Discussed financial obligations and resolved any difficulty in obtaining medication.  Follow up in clinic in 1 month for visit to discuss cost, side effects, bleeding, etc.  01/06/14 Scr - 1.3 mg/dL (stable for patient) Hemoglobin - 13.7 - improved from 10.7 last month.  Plan: 1.  Patient advised to start Eliquis 2.5 mg bid. 2.  Prescription sent to [facility].  Patient will f/u in 1 month with [facility] as scheduled.

## 2014-01-27 ENCOUNTER — Encounter: Payer: Self-pay | Admitting: Cardiology

## 2014-01-27 ENCOUNTER — Ambulatory Visit (INDEPENDENT_AMBULATORY_CARE_PROVIDER_SITE_OTHER): Payer: Medicare Other | Admitting: Cardiology

## 2014-01-27 VITALS — BP 140/86 | HR 76 | Ht 63.0 in | Wt 160.4 lb

## 2014-01-27 DIAGNOSIS — Z7901 Long term (current) use of anticoagulants: Secondary | ICD-10-CM

## 2014-01-27 DIAGNOSIS — I4891 Unspecified atrial fibrillation: Secondary | ICD-10-CM

## 2014-01-27 DIAGNOSIS — S301XXA Contusion of abdominal wall, initial encounter: Secondary | ICD-10-CM

## 2014-01-27 NOTE — Progress Notes (Signed)
[place]. [address], [address], [place]  [address] Phone: [phone] Fax:  [phone]  Date:  01/27/2014   ID:  Linda Avila, DOB 17-Feb-1928, MRN [id number]  PCP:  Mathews Argyle, MD   History of Present Illness: Linda Avila is a 80 y.o. female with persistent atrial fibrillation, who has had 2 separate episodes of bleeding while on Coumadin. The first episode occurred after she was taking double the dose she was prescribed. The second occurred after she was in [place] and received antibiotic for urinary tract infection and had a supratherapeutic INR of 4 and developed a rectus abdominal muscle spontaneous bleed. This was in early December 2014. Critical care medicine followed her and we decided to hold her anticoagulation at that time and to be discussed.  Hemoglobin was 6.5 down from 11.5 at baseline.    Wt Readings from Last 3 Encounters:  01/27/14 160 lb 6.4 oz (72.757 kg)  12/16/13 163 lb (73.936 kg)  12/10/13 159 lb 3.2 oz (72.213 kg)     Past Medical History  Diagnosis Date  . Coronary artery disease   . Hypertension   . Atrial fibrillation   . Hypothyroidism   . Anxiety   . Hypothyroidism   . Anxiety   . Overactive bladder   . Renal cyst   . IBS (irritable bowel syndrome)   . Diverticulosis   . Osteoarthritis   . Renal disease   . Adenomatous polyp   . Osteoporosis     Past Surgical History  Procedure Laterality Date  . Back surgery    . Rotator cuff repair      Right  . Knee surgery      Right  . Abdominal hysterectomy    . Appendectomy    . Cataract extraction      Bilateral    Current Outpatient Prescriptions  Medication Sig Dispense Refill  . ALPRAZolam (XANAX) 0.25 MG tablet Take 0.25 mg by mouth at bedtime as needed. For anxiety      . apixaban (ELIQUIS) 2.5 MG TABS tablet Take 1 tablet (2.5 mg total) by mouth 2 (two) times daily.  60 tablet  5  . diltiazem (CARDIZEM CD) 240 MG 24 hr capsule Take 1 capsule (240 mg total) by  mouth daily.  30 capsule  1  . DULoxetine (CYMBALTA) 30 MG capsule Take 30 mg by mouth 2 (two) times daily.      . fentaNYL (DURAGESIC - DOSED MCG/HR) 50 MCG/HR Place 1 patch onto the skin every 3 (three) days.      Marland Kitchen levothyroxine (SYNTHROID, LEVOTHROID) 88 MCG tablet Take 88 mcg by mouth daily.      Marland Kitchen losartan (COZAAR) 50 MG tablet Take 50 mg by mouth daily.      Marland Kitchen oxybutynin (OXYTROL) 3.9 MG/24HR Place 1 patch onto the skin every 3 (three) days. Pt removed patch yesterday 12/06/13      . oxyCODONE-acetaminophen (PERCOCET/ROXICET) 5-325 MG per tablet Take 1 tablet by mouth daily as needed for severe pain. For pain      . predniSONE (DELTASONE) 5 MG tablet Take 5 mg by mouth 2 (two) times daily.       No current facility-administered medications for this visit.    Allergies:    Allergies  Allergen Reactions  . Sulfa Antibiotics Other (See Comments)    unknown    Social History:  The patient  reports that she has never smoked. She does not have  any smokeless tobacco history on file. She reports that she does not drink alcohol or use illicit drugs.   ROS:  Please see the history of present illness.   Minor abdominal discomfort, no syncope, no bleeding. Minor weakness. Mild shortness of breath.    PHYSICAL EXAM: VS:  BP 140/86  Pulse 76  Ht 5\' 3"  (1.6 m)  Wt 160 lb 6.4 oz (72.757 kg)  BMI 28.42 kg/m2 Well nourished, well developed, in no acute distress HEENT: normal Neck: no JVD Cardiac:  normal; RRR; no murmur Lungs:  clear to auscultation bilaterally, no wheezing, rhonchi or rales Abd: soft, nontender, no hepatomegalyecchymosis noted along right flank, umbilicus Ext: no edema Skin: warm and dry Neuro: no focal abnormalities noted  EKG: sinus rhythm Echocardiogram 10/11/13: Left ventricle: The cavity size was normal. Wall thickness was increased in a pattern of moderate LVH. Systolic function was normal. The estimated ejection fraction was in the range of 60% to 65%. Doppler  parameters are consistent with abnormal left ventricular relaxation (grade 1 diastolic dysfunction).     ASSESSMENT AND PLAN:  1. Atrial fibrillation -high risk for stroke. She's had bleeding issues with Coumadin in the past. Rectus sheath hematoma. Now this has resolved. Her hemoglobin has been stable. She is on low-dose Eliquis. We will continue. She is being closely monitored as well by [employer], Pharm.D. She's having easy bruising. 2. Abdominal wall bleed-recent hospitalization, one unit packed red blood cells. As above. Stable. Improving. 3. Chronic anticoagulation-as above.       Signed, Linda Furbish, MD [facility]  01/27/2014 12:04 PM

## 2014-01-27 NOTE — Patient Instructions (Signed)
Your physician recommends that you continue on your current medications as directed. Please refer to the Current Medication list given to you today.  Your physician recommends that you schedule a follow-up appointment in: 3 months with Dr. Magdalen Spatz

## 2014-02-03 ENCOUNTER — Ambulatory Visit (INDEPENDENT_AMBULATORY_CARE_PROVIDER_SITE_OTHER): Payer: Medicare Other | Admitting: Pharmacist

## 2014-02-03 ENCOUNTER — Other Ambulatory Visit: Payer: Self-pay | Admitting: Cardiology

## 2014-02-03 DIAGNOSIS — Z5181 Encounter for therapeutic drug level monitoring: Secondary | ICD-10-CM

## 2014-02-03 DIAGNOSIS — I4891 Unspecified atrial fibrillation: Secondary | ICD-10-CM

## 2014-02-03 LAB — BASIC METABOLIC PANEL
BUN: 21 mg/dL (ref 6–23)
CO2: 27 mEq/L (ref 19–32)
Calcium: 9.6 mg/dL (ref 8.4–10.5)
Chloride: 106 mEq/L (ref 96–112)
Creatinine, Ser: 1.2 mg/dL (ref 0.4–1.2)
GFR: 44.64 mL/min — ABNORMAL LOW (ref >60.00)
GLUCOSE: 123 mg/dL — AB (ref 70–99)
POTASSIUM: 3.6 meq/L (ref 3.5–5.1)
Sodium: 141 mEq/L (ref 135–145)

## 2014-02-03 LAB — CBC
HCT: 40.5 % (ref 36.0–46.0)
HEMOGLOBIN: 13 g/dL (ref 12.0–15.0)
MCHC: 32.2 g/dL (ref 30.0–36.0)
MCV: 101.1 fl — ABNORMAL HIGH (ref 78.0–100.0)
Platelets: 279 10*3/uL (ref 150.0–400.0)
RBC: 4.01 Mil/uL (ref 3.87–5.11)
RDW: 15.9 % — ABNORMAL HIGH (ref 11.5–14.6)
WBC: 11 10*3/uL — ABNORMAL HIGH (ref 4.5–10.5)

## 2014-02-03 NOTE — Progress Notes (Signed)
Quick Note:  Preliminary report reviewed by triage nurse and sent to MD desk. ______ 

## 2014-02-03 NOTE — Progress Notes (Signed)
Pt was started on Eliquis 2.5 mg bid for AFib on 01/06/14.  Patient has a h/o falls and bleeding, therefore warfarin was changed to Eliquis 2.5 mg bid.  She is tolerating this well so far.  We are keeping a close eye on her CBC while on Eliquis.  She is moving to [place] mid April this year, so will check a CBC and BMET today, and also see her back in 6 weeks to assess this blood work before she moves to [place].  They are going to find a physician there over the next month, and we can forward records to this provider before they move.  Reviewed patients medication list.  Pt is not currently on any combined P-gp and strong CYP3A4 inhibitors/inducers (ketoconazole, traconazole, ritonavir, carbamazepine, phenytoin, rifampin, St. John's wort).  Reviewed labs.  SCr 1.2, Weight 157, CrCl- 40 ml/min.  Dose appropriate based on CrCl, fall risk and h/o bleeding.   Hgb and HCT Within Normal Limits (hemoglobin 13.0 today)  Patient notified to continue Eliquis 2.5 mg bid and to f/u with me in 6 weeks as planned prior to moving to [place].  A full discussion of the nature of anticoagulants has been carried out.  A benefit/risk analysis has been presented to the patient, so that they understand the justification for choosing anticoagulation with Eliquis at this time.  The need for compliance is stressed.  Pt is aware to take the medication twice daily.  Side effects of potential bleeding are discussed, including unusual colored urine or stools, coughing up blood or coffee ground emesis, nose bleeds or serious fall or head trauma.  Discussed signs and symptoms of stroke. The patient should avoid any OTC items containing aspirin or ibuprofen.  Avoid alcohol consumption.   Call if any signs of abnormal bleeding.  Discussed financial obligations and resolved any difficulty in obtaining medication.  Next appointment in 6 weeks 03/21/14 - at 11:15

## 2014-02-03 NOTE — Patient Instructions (Signed)
A full discussion of the nature of anticoagulants has been carried out.  A benefit/risk analysis has been presented to the patient, so that they understand the justification for choosing anticoagulation with Eliquis at this time.  The need for compliance is stressed.  Pt is aware to take the medication twice daily.  Side effects of potential bleeding are discussed, including unusual colored urine or stools, coughing up blood or coffee ground emesis, nose bleeds or serious fall or head trauma.  Discussed signs and symptoms of stroke. The patient should avoid any OTC items containing aspirin or ibuprofen.  Avoid alcohol consumption.   Call if any signs of abnormal bleeding.  Discussed financial obligations and resolved any difficulty in obtaining medication.  Next appointment in 6 weeks 03/21/14 - at 11:15

## 2014-02-08 NOTE — Addendum Note (Signed)
Addended by: Phineas Inches D on: 02/08/2014 10:39 AM   Modules accepted: Orders

## 2014-03-04 ENCOUNTER — Other Ambulatory Visit: Payer: Self-pay

## 2014-03-04 MED ORDER — APIXABAN 2.5 MG PO TABS: 2.5000 mg | ORAL_TABLET | Freq: Two times a day (BID) | ORAL | Status: AC

## 2014-03-08 ENCOUNTER — Other Ambulatory Visit: Payer: Medicare Other

## 2014-03-16 ENCOUNTER — Other Ambulatory Visit (INDEPENDENT_AMBULATORY_CARE_PROVIDER_SITE_OTHER): Payer: Medicare Other

## 2014-03-16 DIAGNOSIS — Z5181 Encounter for therapeutic drug level monitoring: Secondary | ICD-10-CM

## 2014-03-16 DIAGNOSIS — I4891 Unspecified atrial fibrillation: Secondary | ICD-10-CM

## 2014-03-16 LAB — CBC WITH DIFFERENTIAL/PLATELET
BASOS ABS: 0 10*3/uL (ref 0.0–0.1)
Basophils Relative: 0.3 % (ref 0.0–3.0)
EOS PCT: 0.8 % (ref 0.0–5.0)
Eosinophils Absolute: 0.1 10*3/uL (ref 0.0–0.7)
HCT: 40.6 % (ref 36.0–46.0)
Hemoglobin: 13.1 g/dL (ref 12.0–15.0)
LYMPHS PCT: 16.2 % (ref 12.0–46.0)
Lymphs Abs: 1.9 10*3/uL (ref 0.7–4.0)
MCHC: 32.3 g/dL (ref 30.0–36.0)
MCV: 97.6 fl (ref 78.0–100.0)
Monocytes Absolute: 1.2 10*3/uL — ABNORMAL HIGH (ref 0.1–1.0)
Monocytes Relative: 10.1 % (ref 3.0–12.0)
Neutro Abs: 8.6 10*3/uL — ABNORMAL HIGH (ref 1.4–7.7)
Neutrophils Relative %: 72.6 % (ref 43.0–77.0)
PLATELETS: 271 10*3/uL (ref 150.0–400.0)
RBC: 4.16 Mil/uL (ref 3.87–5.11)
RDW: 14.7 % — AB (ref 11.5–14.6)
WBC: 11.9 10*3/uL — AB (ref 4.5–10.5)

## 2014-03-21 ENCOUNTER — Ambulatory Visit (INDEPENDENT_AMBULATORY_CARE_PROVIDER_SITE_OTHER): Payer: Medicare Other | Admitting: Pharmacist

## 2014-03-21 DIAGNOSIS — I4891 Unspecified atrial fibrillation: Secondary | ICD-10-CM

## 2014-03-21 DIAGNOSIS — Z5181 Encounter for therapeutic drug level monitoring: Secondary | ICD-10-CM

## 2014-03-21 NOTE — Progress Notes (Signed)
Pt was started on Eliquis 2.5 mg bid for AFib on 01/06/14.   Patient has a h/o falls and bleeding, therefore warfarin was changed to Eliquis 2.5 mg bid. She is tolerating this well so far. We are keeping a close eye on her CBC while on Eliquis. She is moving to [place] in the next 1-2 weeks, so she wanted to come in to be seen one last time.  They have already found a cardiologist in [facility], [place], Dr. Karrie Meres at [facility].  They are scheduled to see him for the first time in late 03/2014.  I gave patient written notification that we typically are trying to check her CBC every few months given her h/o bleeds, so she can share this with her cardiologist.     Reviewed patients medication list. Pt is not currently on any combined P-gp and strong CYP3A4 inhibitors/inducers (ketoconazole, traconazole, ritonavir, carbamazepine, phenytoin, rifampin, St. John's wort). Reviewed labs. SCr 1.2 (01/2014), Weight 157 lbs, age 80 y.o., Hemoglobin 13.1 (02/2014) is stable. Dose appropriate based on CrCl, fall risk and h/o bleeding. Hgb and HCT Within Normal Limits (hemoglobin 13.1 currently).   Patient notified to continue Eliquis 2.5 mg bid and to f/u with Dr. Karrie Meres in [place] in 6 weeks as planned.   A full discussion of the nature of anticoagulants has been carried out. A benefit/risk analysis has been presented to the patient, so that they understand the justification for choosing anticoagulation with Eliquis at this time. The need for compliance is stressed. Pt is aware to take the medication twice daily. Side effects of potential bleeding are discussed, including unusual colored urine or stools, coughing up blood or coffee ground emesis, nose bleeds or serious fall or head trauma. Discussed signs and symptoms of stroke. The patient should avoid any OTC items containing aspirin or ibuprofen. Avoid alcohol consumption. Call if any signs of abnormal bleeding. Cost hasn't been an issue with Eliquis,  so hopefully this won't be an obstacle for compliance in the future.

## 2014-05-20 IMAGING — CT CT ABD-PELV W/ CM
2 of 5 series · 15 of 46 positions shown, 17 images · IV contrast (CONTRAST)
Comparison: None.

CLINICAL DATA: Abdominal pain.  On anticoagulants.  Possible fall.

EXAM:
CT ABDOMEN AND PELVIS WITH CONTRAST
TECHNIQUE: Multidetector CT imaging of the abdomen and pelvis was performed
using the standard protocol following bolus administration of
intravenous contrast.
CONTRAST:  80mL OMNIPAQUE IOHEXOL 300 MG/ML  SOLN

[Series 2: routine · axial · 0.71mm/px · z∈[-492,-92]mm · 12 of 91 slices shown, 14 images]
[im 6/91  soft-tissue]
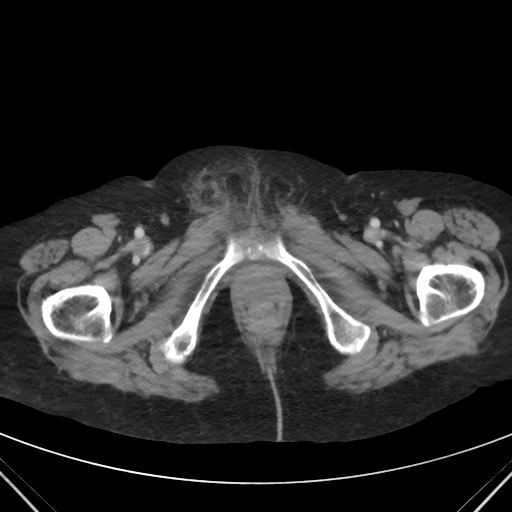
[im 6/91  bone]
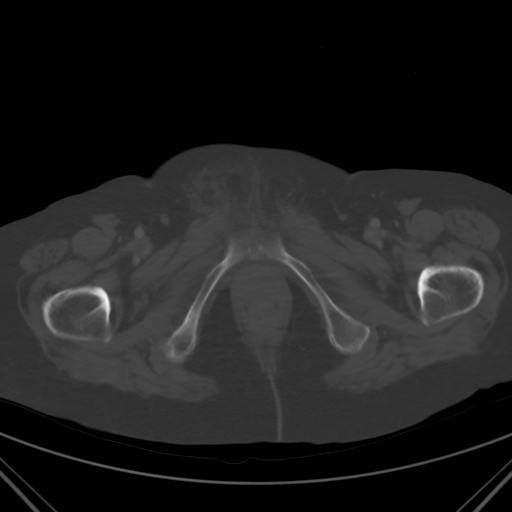
[im 16/91  soft-tissue]
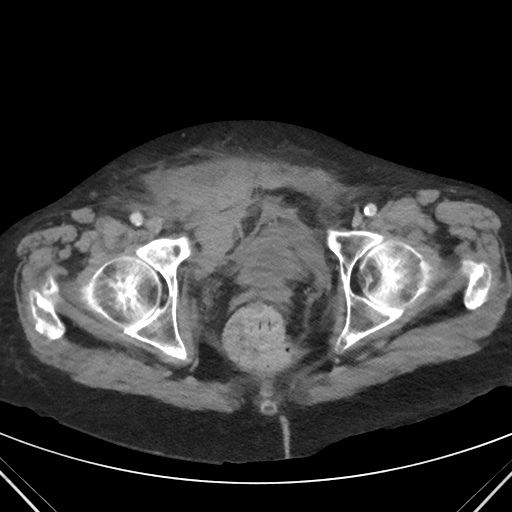
[im 21/91  soft-tissue]
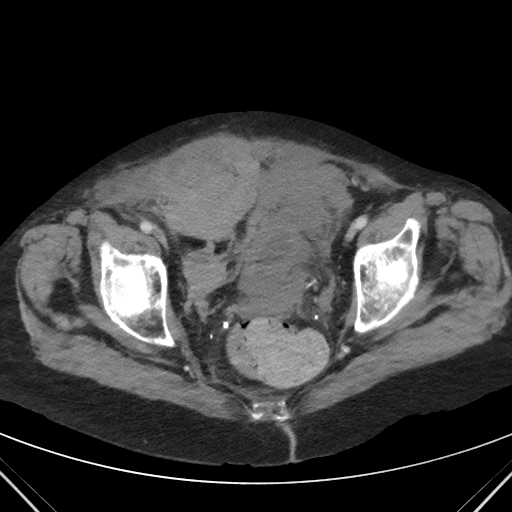
[im 26/91  soft-tissue]
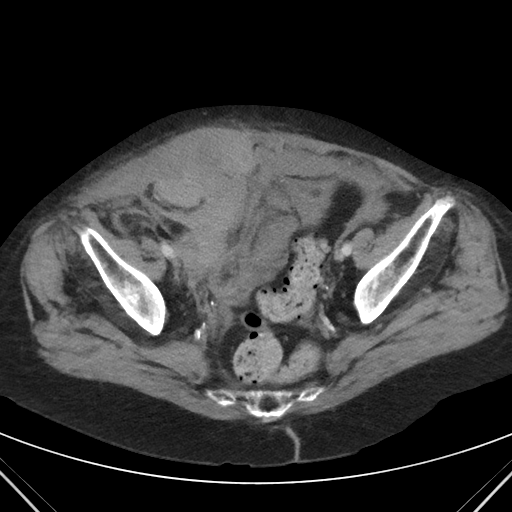
[im 36/91  soft-tissue]
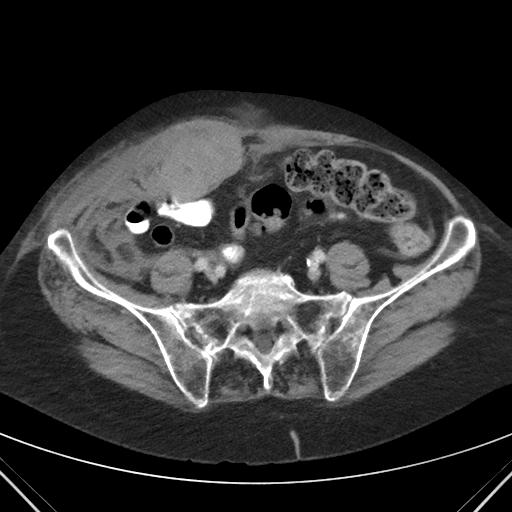
[im 41/91  soft-tissue]
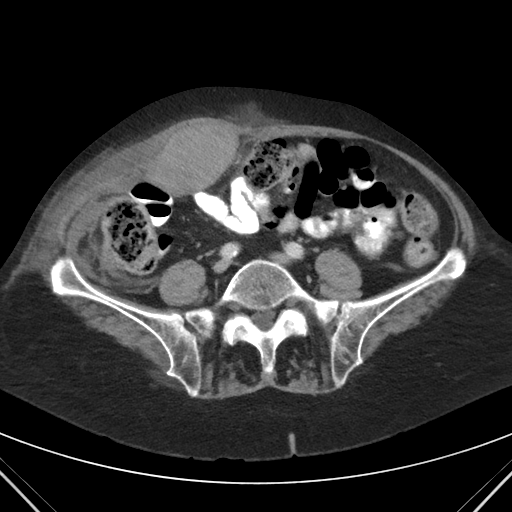
[im 51/91  soft-tissue]
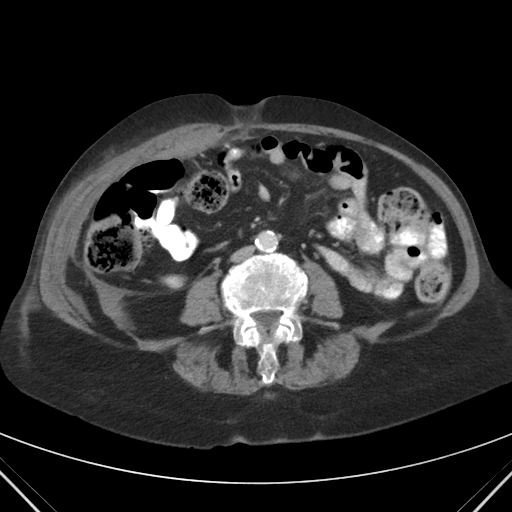
[im 56/91  soft-tissue]
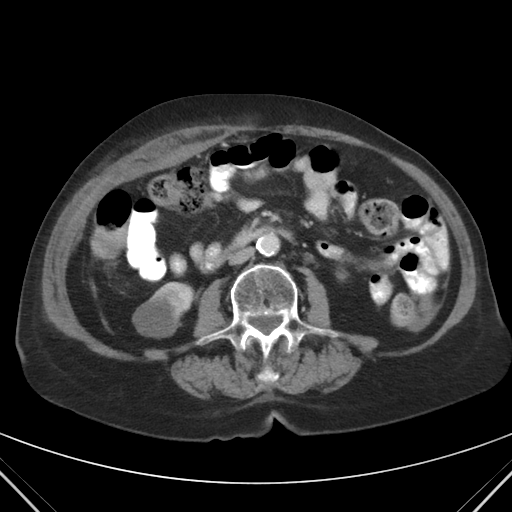
[im 66/91  soft-tissue]
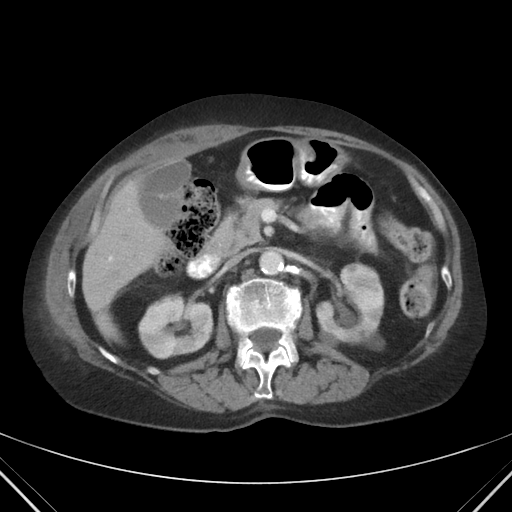
[im 66/91  bone]
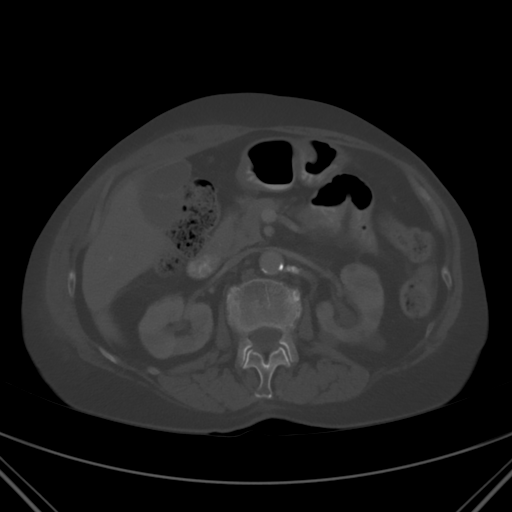
[im 71/91  soft-tissue]
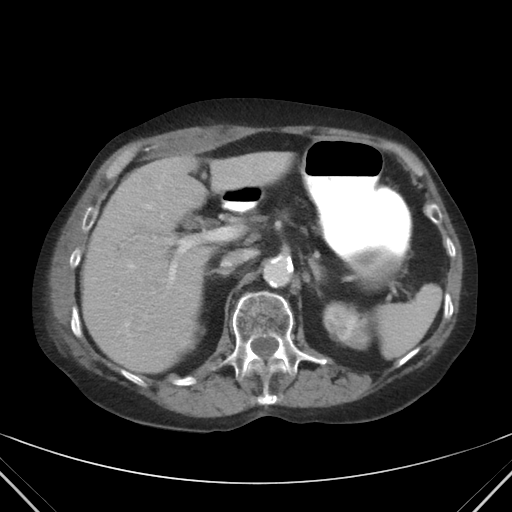
[im 76/91  soft-tissue]
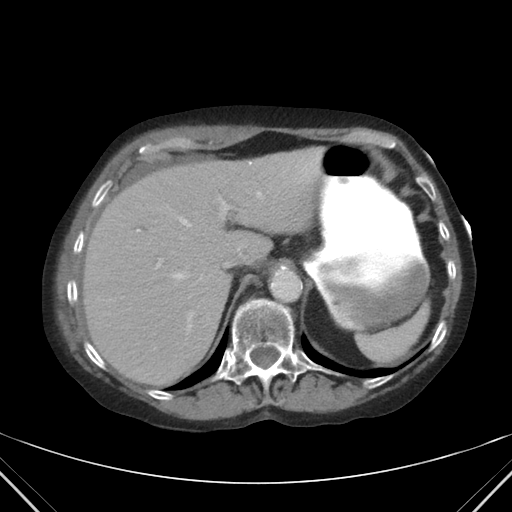
[im 86/91  soft-tissue]
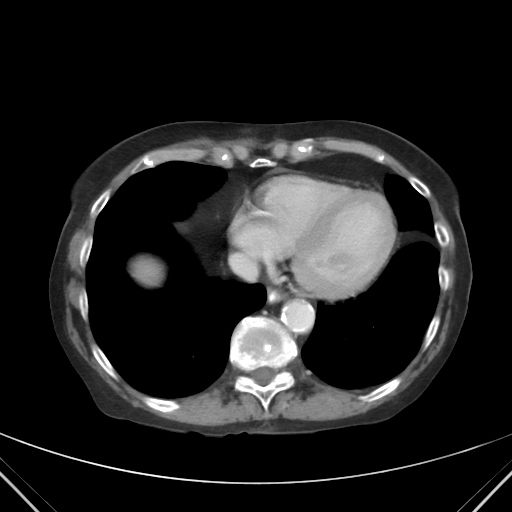

[coronal · coronal · 0.88mm/px · 3 of 93 slices shown]
[im 31/93  soft-tissue]
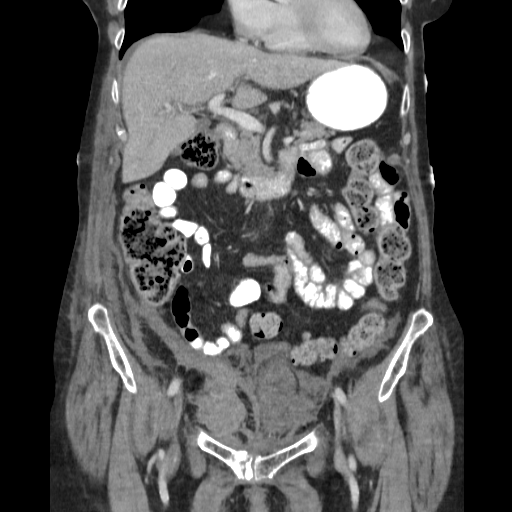
[im 41/93  soft-tissue]
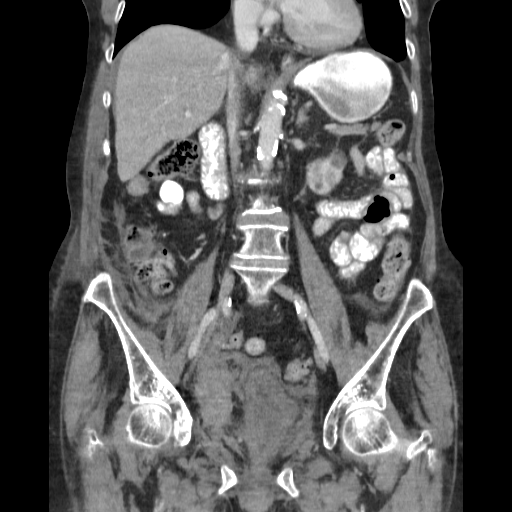
[im 52/93  soft-tissue]
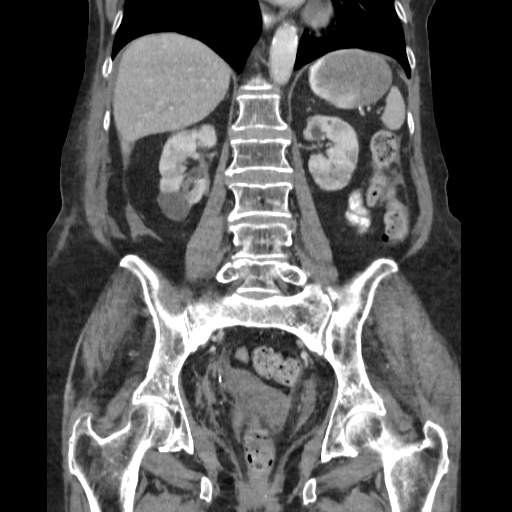

[15 of 46 positions shown; findings below may reference images not displayed]

FINDINGS: There is a large right-sided rectus sheath hematoma. This measures
8.3 cm transversely x 4.7 cm AP x 11.4 cm longitudinally. There is
extension of blood inferiorly along the fascial planes and there is
also some blood within the peritoneal cavity of the pelvis. There is
no evidence of a subcapsular hemorrhage of the liver or spleen or
kidneys. The kidneys demonstrate multiple hypodensities which may
reflect cysts. One hypodensity in the lower pole of the right kidney
measures 3.2 cm in greatest dimension and has Hounsfield measurment
of +7. On the left a midpole cystic appearing structure measures
x 2.1 cm and exhibits Hounsfield measurement of +2. There is no
hyperdense urine demonstrated. The perinephric fat is normal in
density. The caliber of the abdominal aorta is normal. There are
subcentimeter hypodensities in the liver which likely reflects
cysts. There is a small amount of intermediate density fluid
surrounding the liver. It has Hounsfield measurement of +27. The
gallbladder, pancreas, spleen, partially distended stomach, and
adrenal glands are normal in appearance. The partially contrast
filled loops of small bowel appear normal. The colon demonstrates a
normal stool and gas pattern.

The lumbar vertebral bodies are preserved in height. There is
sclerosis of the parasymphyseal portion of the right superior pubic
ramus weeks with an adjacent line. This may be old but correlation
with symptoms here is needed. I do not see pelvic fractures
elsewhere. The psoas musculature appears normal. The lung bases are
clear.
IMPRESSION: 1. There is an acute right-sided rectus sheath hematoma with
dimensions as given above. There is a small amount fluid surrounding
the liver, and within the pelvis high density fluid in the
retroperitoneum and within the peritoneal cavity having extended
from the inferior aspect of the rectus sheath.
2. There is no evidence of a subcapsular hemorrhage of the liver or
spleen or kidneys. There are multiple hypodensities within the
kidneys likely reflecting cysts but this could be confirmed with
ultrasound when the patient has recovered from her acute injury.
3. There is no acute bowel abnormality. The abdominal aorta exhibits
no evidence of an aneurysm or leakage of blood.
4. There is irregularity of the parasymphyseal portion of the right
superior pubic ramus which appears reflect a fracture which is
likely old given the surrounding sclerosis present. No acute pelvic
fracture or lumbar spine fracture otherwise is demonstrated.
5. There is no evidence of a pleural effusion nor of a basilar
pneumothorax.
6. These results were called by me by telephone at the time of
interpretation on 12/07/2013 at [DATE] to Dr. DONG BABBITT , who
verbally acknowledged these results.

## 2017-09-29 DEATH — deceased
